# Patient Record
Sex: Male | Born: 1995 | Race: White | Hispanic: No | Marital: Single | Smoking: Former smoker
Health system: Southern US, Community
[De-identification: ages and names within clinical notes are randomized; demographics above are authoritative.]

## PROBLEM LIST (undated history)

## (undated) DIAGNOSIS — F909 Attention-deficit hyperactivity disorder, unspecified type: Secondary | ICD-10-CM

## (undated) HISTORY — PX: OTHER SURGICAL HISTORY: SHX169

---
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
## 2016-08-16 ENCOUNTER — Encounter (HOSPITAL_COMMUNITY): Payer: Self-pay | Admitting: Emergency Medicine

## 2016-08-16 ENCOUNTER — Emergency Department (HOSPITAL_COMMUNITY)
Admission: EM | Admit: 2016-08-16 | Discharge: 2016-08-16 | Disposition: A | Discharge status: Home / Self Care | Payer: 59 | Attending: Emergency Medicine | Admitting: Emergency Medicine

## 2016-08-16 ENCOUNTER — Emergency Department (HOSPITAL_COMMUNITY): Payer: 59

## 2016-08-16 DIAGNOSIS — Y999 Unspecified external cause status: Secondary | ICD-10-CM | POA: Diagnosis not present

## 2016-08-16 DIAGNOSIS — F17200 Nicotine dependence, unspecified, uncomplicated: Secondary | ICD-10-CM | POA: Diagnosis not present

## 2016-08-16 DIAGNOSIS — S4992XA Unspecified injury of left shoulder and upper arm, initial encounter: Secondary | ICD-10-CM | POA: Diagnosis present

## 2016-08-16 DIAGNOSIS — Y939 Activity, unspecified: Secondary | ICD-10-CM | POA: Insufficient documentation

## 2016-08-16 DIAGNOSIS — S42032A Displaced fracture of lateral end of left clavicle, initial encounter for closed fracture: Secondary | ICD-10-CM | POA: Insufficient documentation

## 2016-08-16 DIAGNOSIS — Y92410 Unspecified street and highway as the place of occurrence of the external cause: Secondary | ICD-10-CM | POA: Insufficient documentation

## 2016-08-16 HISTORY — DX: Attention-deficit hyperactivity disorder, unspecified type: F90.9
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
## 2017-03-27 ENCOUNTER — Other Ambulatory Visit: Payer: Self-pay

## 2017-03-27 ENCOUNTER — Ambulatory Visit: Payer: BC Managed Care – PPO | Admitting: Physician Assistant

## 2017-03-27 ENCOUNTER — Encounter: Payer: Self-pay | Admitting: Physician Assistant

## 2017-03-27 VITALS — BP 100/60 | HR 96 | Temp 98.1°F | Resp 14 | Ht 74.0 in | Wt 173.0 lb

## 2017-03-27 DIAGNOSIS — F900 Attention-deficit hyperactivity disorder, predominantly inattentive type: Secondary | ICD-10-CM | POA: Diagnosis not present

## 2017-03-27 DIAGNOSIS — Z87898 Personal history of other specified conditions: Secondary | ICD-10-CM

## 2017-03-27 DIAGNOSIS — F1011 Alcohol abuse, in remission: Secondary | ICD-10-CM | POA: Insufficient documentation

## 2017-03-27 MED ORDER — CLONIDINE HCL ER 0.1 MG PO TB12: ORAL_TABLET | ORAL | 3 refills | Status: DC

## 2017-03-27 NOTE — Assessment & Plan Note (Signed)
CSC signed. Will be scanned into file. Patient doing very well. Will send in refills. Vyvanse is not due until the 30th. Will send at that time. Patient to follow-up every 6 months for assessment. He is also to schedule CPE with fasting labs in the next few weeks.

## 2017-03-27 NOTE — Assessment & Plan Note (Signed)
Doing very well. Will continue to monitor.

## 2017-03-27 NOTE — Patient Instructions (Signed)
Please continue chronic medications as directed. I will take over for you now that you are established with us.  Keep up the good work with Alcoholics Anonymous. You should be very proud of yourself!  Please schedule a complete physical at your earliest convenience.  We will obtain labs at that appointment.   We will follow-up every 6 months for ADHD.  It was a pleasure meeting you today. Welcome to [facility]!

## 2017-03-27 NOTE — Progress Notes (Signed)
Patient presents to clinic today to establish care.  Diet -- Mostly snack foods. Is trying to work or better options.   Acute Concerns: Patient without acute concerns today.   Chronic Issues: ADHD -- is currently on Vyvanse 40 mg daily. Is tolerating well without side effect. Endorses great focus with medication. Is seeing [facility] currently but having issue with cost of co-pays. Would like [place] to take over medication.   Alcohol Abuse -- History of DUI. AA every night during the week. Is seven months sober and doing very well.   Health Maintenance: Immunizations -- patient feels up-to-date. Will obtain records.    Past Medical History:  Diagnosis Date  . ADHD     Past Surgical History:  Procedure Laterality Date  . arm surgery     Had to be sedated to have arm reset    Current Outpatient Medications on File Prior to Visit  Medication Sig Dispense Refill  . VYVANSE 40 MG capsule TAKE 1 CAPSULE BY MOUTH DAILY REFILL DUE 03/07/2017 OR 30 DAYS FROM LAST REFILL  0   No current facility-administered medications on file prior to visit.     No Known Allergies  Family History  Problem Relation Age of Onset  . Depression Mother   . Depression Father   . Mental illness Maternal Grandmother   . Mental illness Maternal Grandfather   . Mental illness Paternal Grandmother   . Mental illness Paternal Grandfather     Social History   Socioeconomic History  . Marital status: Single    Spouse name: Not on file  . Number of children: Not on file  . Years of education: Not on file  . Highest education level: Not on file  Social Needs  . Financial resource strain: Not on file  . Food insecurity - worry: Not on file  . Food insecurity - inability: Not on file  . Transportation needs - medical: Not on file  . Transportation needs - non-medical: Not on file  Occupational History  . Occupation: Energy manager  Tobacco Use  . Smoking status: Former Smoker    Packs/day: 1.00    Years: 4.00    Pack years: 4.00    Types: Cigarettes    Last attempt to quit: 08/25/2016    Years since quitting: 0.5  . Smokeless tobacco: Never Used  Substance and Sexual Activity  . 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 Drug use: No  . Sexual activity: Not Currently    Partners: Female  Other Topics Concern  . 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  HENT: Negative for ear discharge, ear pain, hearing loss and tinnitus.   Eyes: Negative for blurred vision, double vision, photophobia and pain.  Respiratory: Negative for cough and shortness of breath.   Cardiovascular: Negative for chest pain and palpitations.  Gastrointestinal: Negative for abdominal pain, blood in stool, constipation, diarrhea, heartburn, melena, nausea and vomiting.  Genitourinary: Negative for dysuria, flank pain, frequency, hematuria and urgency.  Musculoskeletal: Negative for falls.  Neurological: Negative for dizziness, loss of consciousness and headaches.  Endo/Heme/Allergies: Negative for environmental allergies.  Psychiatric/Behavioral: Negative for depression, hallucinations, substance abuse and suicidal ideas. The patient is not nervous/anxious and does not have insomnia.    BP 100/60  Pulse 96   Temp 98.1 F (36.7 C) (Oral)   Resp 14   Ht 6\' 2"  (1.88 m)   Wt 173 lb (78.5 kg)   SpO2 99%   BMI 22.21 kg/m   Physical Exam  Constitutional: He is oriented to person, place, and time and well-developed, well-nourished, and in no distress.  HENT:  Head: Normocephalic and atraumatic.  Eyes: Conjunctivae are normal.  Neck: Neck supple.  Cardiovascular: Normal rate, regular rhythm, normal heart sounds and intact distal pulses.  Pulmonary/Chest: Effort normal and breath sounds normal. No respiratory distress. He  has no wheezes. He has no rales. He exhibits no tenderness.  Neurological: He is alert and oriented to person, place, and time.  Skin: Skin is warm and dry. No rash noted.  Psychiatric: Affect normal.  Vitals reviewed.  Assessment/Plan: ADHD (attention deficit hyperactivity disorder), inattentive type CSC signed. Will be scanned into file. Patient doing very well. Will send in refills. Vyvanse is not due until the 30th. Will send at that time. Patient to follow-up every 6 months for assessment. He is also to schedule CPE with fasting labs in the next few weeks.   History of alcohol abuse Doing very well. Will continue to monitor.    Piedad ClimesWilliam Cody Trenisha Lafavor, PA-C

## 2017-04-02 ENCOUNTER — Telehealth: Payer: Self-pay | Admitting: Physician Assistant

## 2017-04-02 NOTE — Progress Notes (Deleted)
Patient presents to clinic today for annual exam.  Patient is fasting for labs.  Acute Concerns: ***  Health Maintenance: Immunizations -- up-to-date.  Past Medical History:  Diagnosis Date  . ADHD     Past Surgical History:  Procedure Laterality Date  . arm surgery     Had to be sedated to have arm reset    Current Outpatient Medications on File Prior to Visit  Medication Sig Dispense Refill  . cloNIDine HCl (KAPVAY) 0.1 MG TB12 ER tablet TAKE 1 TABLET BY MOUTH EVERYDAY AT BEDTIME 30 tablet 3  . VYVANSE 40 MG capsule TAKE 1 CAPSULE BY MOUTH DAILY REFILL DUE 03/07/2017 OR 30 DAYS FROM LAST REFILL  0   No current facility-administered medications on file prior to visit.     No Known Allergies  Family History  Problem Relation Age of Onset  . Depression Mother   . Depression Father   . Mental illness Maternal Grandmother   . Mental illness Maternal Grandfather   . Mental illness Paternal Grandmother   . Mental illness Paternal Grandfather     Social History   Socioeconomic History  . Marital status: Single    Spouse name: Not on file  . Number of children: Not on file  . Years of education: Not on file  . Highest education level: Not on file  Social Needs  . Financial resource strain: Not on file  . Food insecurity - worry: Not on file  . Food insecurity - inability: Not on file  . Transportation needs - medical: Not on file  . Transportation needs - non-medical: Not on file  Occupational History  . Occupation: [employer]  Tobacco Use  . Smoking status: Former Smoker    Packs/day: 1.00    Years: 4.00    Pack years: 4.00    Types: Cigarettes    Last attempt to quit: 08/25/2016    Years since quitting: 0.6  . Smokeless tobacco: Never Used  Substance and Sexual Activity  . 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 Drug use: No  . Sexual activity: Not Currently    Partners: Female  Other Topics Concern  . Not  on file  Social History Narrative   Works at [employer]   Plays electric guitar in band    Review of Systems  Constitutional: Negative for fever and weight loss.  HENT: Negative for ear discharge, ear pain, hearing loss and tinnitus.   Eyes: Negative for blurred vision, double vision, photophobia and pain.  Respiratory: Negative for cough and shortness of breath.   Cardiovascular: Negative for chest pain and palpitations.  Gastrointestinal: Negative for abdominal pain, blood in stool, constipation, diarrhea, heartburn, melena, nausea and vomiting.  Genitourinary: Negative for dysuria, flank pain, frequency, hematuria and urgency.  Musculoskeletal: Negative for falls.  Neurological: Negative for dizziness, loss of consciousness and headaches.  Endo/Heme/Allergies: Negative for environmental allergies.  Psychiatric/Behavioral: Negative for depression, hallucinations, substance abuse and suicidal ideas. The patient is not nervous/anxious and does not have insomnia.     There were no vitals taken for this visit.  Physical Exam  Constitutional: He is oriented to person, place, and time and well-developed, well-nourished, and in no distress.  HENT:  Head: Normocephalic and atraumatic.  Right Ear: External ear normal.  Left Ear: External ear normal.  Nose: Nose normal.  Mouth/Throat: Oropharynx is clear and moist. No oropharyngeal exudate.  Eyes:  Conjunctivae and EOM are normal. Pupils are equal, round, and reactive to light.  Neck: Neck supple. No thyromegaly present.  Cardiovascular: Normal rate, regular rhythm, normal heart sounds and intact distal pulses.  Pulmonary/Chest: Effort normal and breath sounds normal. No respiratory distress. He has no wheezes. He has no rales. He exhibits no tenderness.  Abdominal: Soft. Bowel sounds are normal. He exhibits no distension and no mass. There is no tenderness. There is no rebound and no guarding.  Genitourinary: Testes/scrotum normal and  penis normal. No discharge found.  Lymphadenopathy:    He has no cervical adenopathy.  Neurological: He is alert and oriented to person, place, and time.  Skin: Skin is warm and dry. No rash noted.  Psychiatric: Affect normal.  Vitals reviewed.   No results found for this or any previous visit (from the past 2160 hour(s)).  Assessment/Plan: No problem-specific Assessment & Plan notes found for this encounter.    Piedad Climes, PA-C

## 2017-04-02 NOTE — Telephone Encounter (Signed)
Vyvanse refill Last OV: 03/27/17 ; Next OV for physical 04/03/17 Last Refill:03/04/17 Pharmacy:[facility] [place] on [address]

## 2017-04-02 NOTE — Telephone Encounter (Signed)
Copied from CRM #[id number]. Topic: Quick Communication - Rx Refill/Question >> Apr 02, 2017  2:39 PM Guinevere FerrariMorris, Trestan Vahle E, NT wrote: Medication :VYVANSE 40 MG capsule:   Has the patient contacted their pharmacy? Yes    (Agent: If no, request that the patient contact the pharmacy for the refill.)  [id number] - [place], [place] - [address] [phone] (Phone) [phone] (Fax)   Preferred Pharmacy (with phone number or street name):    Agent: Please be advised that RX refills may take up to 3 business days. We ask that you follow-up with your pharmacy.

## 2017-04-03 ENCOUNTER — Encounter: Payer: BC Managed Care – PPO | Admitting: Physician Assistant

## 2017-04-03 DIAGNOSIS — Z0289 Encounter for other administrative examinations: Secondary | ICD-10-CM

## 2017-04-03 MED ORDER — VYVANSE 40 MG PO CAPS: ORAL_CAPSULE | ORAL | 0 refills | Status: DC

## 2017-04-03 NOTE — Telephone Encounter (Signed)
Rx sent 

## 2017-04-03 NOTE — Addendum Note (Signed)
Addended by: Waldon MerlMARTIN, Kaleya Douse C on: 04/03/2017 08:55 AM   Modules accepted: Orders

## 2017-04-30 ENCOUNTER — Other Ambulatory Visit: Payer: Self-pay | Admitting: Emergency Medicine

## 2017-04-30 MED ORDER — VYVANSE 40 MG PO CAPS: ORAL_CAPSULE | ORAL | 0 refills | Status: DC

## 2017-04-30 NOTE — Telephone Encounter (Signed)
Last rx for Vyvanse on 04/03/17 #30 CSC: 03/27/17

## 2017-05-01 ENCOUNTER — Other Ambulatory Visit: Payer: Self-pay | Admitting: Physician Assistant

## 2017-05-01 NOTE — Telephone Encounter (Signed)
Copied from CRM [phone]. Topic: Quick Communication - Rx Refill/Question >> May 01, 2017  4:35 PM Louie BunPalacios Medina, Rosey Batheresa D wrote: Medication: VYVANSE 40 MG capsule   Has the patient contacted their pharmacy?No because it's control substance med.   (Agent: If no, request that the patient contact the pharmacy for the refill.)   Preferred Pharmacy (with phone number or street name): [id number] - [place], [place] - [address]   Agent: Please be advised that RX refills may take up to 3 business days. We ask that you follow-up with your pharmacy.

## 2017-05-03 ENCOUNTER — Encounter: Payer: Self-pay | Admitting: Family Medicine

## 2017-05-03 ENCOUNTER — Other Ambulatory Visit: Payer: Self-pay

## 2017-05-03 ENCOUNTER — Ambulatory Visit: Payer: BC Managed Care – PPO | Admitting: Family Medicine

## 2017-05-03 VITALS — BP 122/80 | HR 103 | Temp 98.5°F | Ht 74.0 in | Wt 176.6 lb

## 2017-05-03 DIAGNOSIS — B9689 Other specified bacterial agents as the cause of diseases classified elsewhere: Secondary | ICD-10-CM | POA: Diagnosis not present

## 2017-05-03 DIAGNOSIS — J208 Acute bronchitis due to other specified organisms: Secondary | ICD-10-CM | POA: Diagnosis not present

## 2017-05-03 MED ORDER — AZITHROMYCIN 250 MG PO TABS
ORAL_TABLET | ORAL | 0 refills | Status: DC
Start: 2017-05-03 — End: 2017-10-16

## 2017-05-03 MED ORDER — GUAIFENESIN-CODEINE 100-10 MG/5ML PO SOLN: 5.0000 mL | Freq: Four times a day (QID) | ORAL | 0 refills | Status: DC | PRN

## 2017-05-03 NOTE — Progress Notes (Signed)
Subjective  CC:  Chief Complaint  Patient presents with  . Cough    Productive cough- brownish-green, had fever on monday.    HPI: SUBJECTIVE:  Mitchell Fry is a 22 y.o. male who complains of congestion, nasal blockage, post nasal drip, cough described as productive and denies sinus, high fevers, SOB, chest pain or significant GI symptoms. Symptoms have been present for 6 days. Had temp max of 99 earlier this week. No sinus pain. He denies a history of anorexia, dizziness, vomiting and wheezing. He denies a history of asthma or COPD. Patient denies and does not smoke cigarettes. Has been traveling on tour so multiple exposures. No GI sxs.   I reviewed the patients updated PMH, FH, and SocHx.  Social History: Patient  reports that he quit smoking about 8 months ago. His smoking use included cigarettes. He has a 4.00 pack-year smoking history. he has never used smokeless tobacco. He reports that he does not drink alcohol or use drugs.  Patient Active Problem List   Diagnosis Date Noted  . History of alcohol abuse 03/27/2017  . ADHD (attention deficit hyperactivity disorder), inattentive type 09/03/2013    Review of Systems: Cardiovascular: negative for chest pain Respiratory: negative for SOB or hemoptysis Gastrointestinal: negative for abdominal pain Genitourinary: negative for dysuria or gross hematuria Current Meds  Medication Sig  . cloNIDine HCl (KAPVAY) 0.1 MG TB12 ER tablet TAKE 1 TABLET BY MOUTH EVERYDAY AT BEDTIME  . VYVANSE 40 MG capsule TAKE 1 CAPSULE BY MOUTH DAIL    Objective  Vitals: BP 122/80   Pulse (!) 103   Temp 98.5 F (36.9 C)   Ht 6\' 2"  (1.88 m)   Wt 176 lb 9.6 oz (80.1 kg)   BMI 22.67 kg/m  General: no acute distress  Psych:  Alert and oriented, normal mood and affect HEENT:  Normocephalic, atraumatic, supple neck, moist mucous membranes, mildly erythematous pharynx without exudate, mild lymphadenopathy, supple neck Cardiovascular:  RRR without  murmur. no edema Respiratory:  Good breath sounds bilaterally, CTAB with normal respiratory effort with occasional rhonchi Skin:  Warm, no rashes Neurologic:   Mental status is normal. normal gait  Assessment  1. Acute bacterial bronchitis      Plan  Discussion:  Treat for bacterial bronchitis due to prolonged course and worsening symptoms. Education regarding differences between viral and bacterial infections and treatment options are discussed.  Supportive care measures are recommended.  We discussed the use of mucolytic's, decongestants, antihistamines and antitussives as needed.  Tylenol or Advil are recommended if needed.  Follow up: Return if symptoms worsen or fail to improve.   Commons side effects, risks, benefits, and alternatives for medications and treatment plan prescribed today were discussed, and the patient expressed understanding of the given instructions. Patient is instructed to call or message via MyChart if he/she has any questions or concerns regarding our treatment plan. No barriers to understanding were identified. We discussed Red Flag symptoms and signs in detail. Patient expressed understanding regarding what to do in case of urgent or emergency type symptoms.  Medication list was reconciled, printed and provided to the patient in AVS. Patient instructions and summary information was reviewed with the patient as documented in the AVS. This note was prepared with assistance of Dragon voice recognition software. Occasional wrong-word or sound-a-like substitutions may have occurred due to the inherent limitations of voice recognition software  No orders of the defined types were placed in this encounter.  Meds ordered this encounter  Medications  . azithromycin (ZITHROMAX) 250 MG tablet    Sig: Take 2 tabs today, then 1 tab daily for 4 days    Dispense:  1 each    Refill:  0  . guaiFENesin-codeine 100-10 MG/5ML syrup    Sig: Take 5 mLs by mouth every 6 (six) hours  as needed for cough.    Dispense:  120 mL    Refill:  0

## 2017-05-03 NOTE — Patient Instructions (Addendum)
Please follow up if symptoms do not improve or as needed.    Acute Bronchitis, Adult Acute bronchitis is sudden (acute) swelling of the air tubes (bronchi) in the lungs. Acute bronchitis causes these tubes to fill with mucus, which can make it hard to breathe. It can also cause coughing or wheezing. In adults, acute bronchitis usually goes away within 2 weeks. A cough caused by bronchitis may last up to 3 weeks. Smoking, allergies, and asthma can make the condition worse. Repeated episodes of bronchitis may cause further lung problems, such as chronic obstructive pulmonary disease (COPD). What are the causes? This condition can be caused by germs and by substances that irritate the lungs, including:  Cold and flu viruses. This condition is most often caused by the same virus that causes a cold.  Bacteria.  Exposure to tobacco smoke, dust, fumes, and air pollution.  What increases the risk? This condition is more likely to develop in people who:  Have close contact with someone with acute bronchitis.  Are exposed to lung irritants, such as tobacco smoke, dust, fumes, and vapors.  Have a weak immune system.  Have a respiratory condition such as asthma.  What are the signs or symptoms? Symptoms of this condition include:  A cough.  Coughing up clear, yellow, or green mucus.  Wheezing.  Chest congestion.  Shortness of breath.  A fever.  Body aches.  Chills.  A sore throat.  How is this diagnosed? This condition is usually diagnosed with a physical exam. During the exam, your health care provider may order tests, such as chest X-rays, to rule out other conditions. He or she may also:  Test a sample of your mucus for bacterial infection.  Check the level of oxygen in your blood. This is done to check for pneumonia.  Do a chest X-ray or lung function testing to rule out pneumonia and other conditions.  Perform blood tests.  Your health care provider will also ask  about your symptoms and medical history. How is this treated? Most cases of acute bronchitis clear up over time without treatment. Your health care provider may recommend:  Drinking more fluids. Drinking more makes your mucus thinner, which may make it easier to breathe.  Taking a medicine for a fever or cough.  Taking an antibiotic medicine.  Using an inhaler to help improve shortness of breath and to control a cough.  Using a cool mist vaporizer or humidifier to make it easier to breathe.  Follow these instructions at home: Medicines  Take over-the-counter and prescription medicines only as told by your health care provider.  If you were prescribed an antibiotic, take it as told by your health care provider. Do not stop taking the antibiotic even if you start to feel better. General instructions  Get plenty of rest.  Drink enough fluids to keep your urine clear or pale yellow.  Avoid smoking and secondhand smoke. Exposure to cigarette smoke or irritating chemicals will make bronchitis worse. If you smoke and you need help quitting, ask your health care provider. Quitting smoking will help your lungs heal faster.  Use an inhaler, cool mist vaporizer, or humidifier as told by your health care provider.  Keep all follow-up visits as told by your health care provider. This is important. How is this prevented? To lower your risk of getting this condition again:  Wash your hands often with soap and water. If soap and water are not available, use hand sanitizer.  Avoid contact with   people who have cold symptoms.  Try not to touch your hands to your mouth, nose, or eyes.  Make sure to get the flu shot every year.  Contact a health care provider if:  Your symptoms do not improve in 2 weeks of treatment. Get help right away if:  You cough up blood.  You have chest pain.  You have severe shortness of breath.  You become dehydrated.  You faint or keep feeling like you are  going to faint.  You keep vomiting.  You have a severe headache.  Your fever or chills gets worse. This information is not intended to replace advice given to you by your health care provider. Make sure you discuss any questions you have with your health care provider. Document Released: 03/30/2004 Document Revised: 09/15/2015 Document Reviewed: 08/11/2015 Elsevier Interactive Patient Education  2018 Elsevier Inc.   

## 2017-06-22 ENCOUNTER — Other Ambulatory Visit: Payer: Self-pay | Admitting: Physician Assistant

## 2017-06-22 NOTE — Telephone Encounter (Signed)
Copied from CRM [id number]. Topic: Quick Communication - Rx Refill/Question >> Jun 22, 2017  1:56 PM Diana EvesHoyt, Maryann B wrote: Medication: VYVANSE 40 MG capsule  Preferred Pharmacy (with phone number or street name): [id number] [place], [place] - [address] Agent: Please be advised that RX refills may take up to 3 business days. We ask that you follow-up with your pharmacy.

## 2017-06-25 MED ORDER — VYVANSE 40 MG PO CAPS: ORAL_CAPSULE | ORAL | 0 refills | Status: DC

## 2017-06-25 NOTE — Telephone Encounter (Signed)
Last OV: 03/27/17 Last Filled: 04/30/17 #30, 0

## 2017-07-17 ENCOUNTER — Other Ambulatory Visit: Payer: Self-pay | Admitting: Physician Assistant

## 2017-07-17 MED ORDER — VYVANSE 40 MG PO CAPS: ORAL_CAPSULE | ORAL | 0 refills | Status: DC

## 2017-07-17 NOTE — Telephone Encounter (Signed)
Copied from CRM [id number]. Topic: Quick Communication - Rx Refill/Question >> Jul 17, 2017 11:17 AM Floria Raveling A wrote: Medication: VYVANSE 40 MG capsule [[id number]]  Has the patient contacted their pharmacy?  (Agent: If no, request that the patient contact the pharmacy for the refill.) Preferred Pharmacy (with phone number or street [facility] [place]- pt is going on vacation on the 21st and want be back til the 16th of June.  He needs to refill this early before he leaves????   Agent: Please be advised that RX refills may take up to 3 business days. We ask that you follow-up with your pharmacy.

## 2017-07-17 NOTE — Telephone Encounter (Signed)
Patient is not due for Vyvanse until 07/25/17 so the prescription sent in notes this. He is due for follow-up before next refill.

## 2017-07-17 NOTE — Telephone Encounter (Signed)
Last Filled: 06/25/17 #30, 0 Last OV: 03/27/17

## 2017-07-17 NOTE — Telephone Encounter (Signed)
[facility] the [facility]/17.

## 2017-07-20 ENCOUNTER — Telehealth: Payer: Self-pay | Admitting: Emergency Medicine

## 2017-07-20 NOTE — Telephone Encounter (Signed)
Left a detailed message on voicemail advising patient the [facility].     Copied from CRM [id number]. Topic: Inquiry >> Jul 20, 2017 12:58 PM Yvonna Alanis wrote: Reason for CRM: Patient called to inquire of his request to receive his VYVANSE 40 MG capsule early as he is going on vacation on 07/24/2017. Please call patient.       Thank You!!!

## 2017-07-20 NOTE — Telephone Encounter (Signed)
Pt called and pharmacist has asked for pcp to call pharmacy to give approval to fill the Rx early

## 2017-08-21 ENCOUNTER — Other Ambulatory Visit: Payer: Self-pay | Admitting: Physician Assistant

## 2017-08-21 NOTE — Telephone Encounter (Signed)
Copied from CRM [id number]. Topic: Quick Communication - Rx Refill/Question >> Aug 21, 2017 10:42 AM [name], Mitchell C wrote: Medication: VYVANSE 40 MG capsule   Has the patient contacted their pharmacy? No  (Agent: If no, request that the patient contact the pharmacy for the refill.) (Agent: If yes, when and what did the pharmacy advise?)  Preferred Pharmacy (with phone number or street name): [id number] [place], [place] - [address] [phone] (Phone) [phone] (Fax)      Agent: Please be advised that RX refills may take up to 3 business days. We ask that you follow-up with your pharmacy.

## 2017-08-21 NOTE — Telephone Encounter (Signed)
Vyvanse refill Last Refill:07/25/17 #30 Last OV: 03/27/17 PCP: Laural Goldenody Martin,PA [facility]

## 2017-08-22 MED ORDER — VYVANSE 40 MG PO CAPS: ORAL_CAPSULE | ORAL | 0 refills | Status: DC

## 2017-09-17 ENCOUNTER — Other Ambulatory Visit: Payer: Self-pay | Admitting: Physician Assistant

## 2017-09-19 ENCOUNTER — Other Ambulatory Visit: Payer: Self-pay | Admitting: Physician Assistant

## 2017-09-19 NOTE — Telephone Encounter (Signed)
Copied from CRM [id number]. Topic: Quick Communication - Rx Refill/Question >> Sep 19, 2017  2:53 PM Jonette EvaBarksdale, Harvey B wrote: Medication: VYVANSE 40 MG capsule [[id number]][[id number]]   Has the patient contacted their pharmacy? Yes.   (Agent: If no, request that the patient contact the pharmacy for the refill.) (Agent: If yes, when and what did the pharmacy advise?)  [facility]  Agent: Please be advised that RX refills may take up to 3 business days. We ask that you follow-up with your pharmacy.

## 2017-09-20 NOTE — Telephone Encounter (Signed)
Refill of vyvanse  LRF 08/22/17  #30  0 refills  LOV 05/03/17 Dr. Mardelle MatteAndy  [id number] [place], [place] - [address]

## 2017-09-21 ENCOUNTER — Other Ambulatory Visit: Payer: Self-pay | Admitting: Emergency Medicine

## 2017-09-21 MED ORDER — VYVANSE 40 MG PO CAPS: ORAL_CAPSULE | ORAL | 0 refills | Status: DC

## 2017-09-21 NOTE — Telephone Encounter (Signed)
Vyvanse last filled 08/22/17 #30 He is due for 6 month follow up for ADHD Please advise  Copied from CRM #[id number]. Topic: Inquiry >> Sep 21, 2017 11:24 AM Yvonna Alanisobinson, Andra M wrote: Reason for CRM: Patient called requesting a update on his refill for VYVANSE 40 MG capsule. Patient is completely out. Please call patient.       Thank You!!!

## 2017-09-21 NOTE — Telephone Encounter (Signed)
Left detailed message on VM advising patient rx for Vyvanse was sent to the pharmacy but he is due for an follow up appointment within the next month. Please call the office to schedule an appointment

## 2017-09-21 NOTE — Telephone Encounter (Signed)
Refill sent to pharmacy.  Needs follow-up within the next month.

## 2017-09-25 NOTE — Telephone Encounter (Signed)
Filled last week

## 2017-10-16 ENCOUNTER — Ambulatory Visit: Payer: BC Managed Care – PPO | Admitting: Physician Assistant

## 2017-10-16 ENCOUNTER — Other Ambulatory Visit: Payer: Self-pay

## 2017-10-16 ENCOUNTER — Encounter: Payer: Self-pay | Admitting: Physician Assistant

## 2017-10-16 VITALS — BP 120/74 | HR 94 | Temp 98.2°F | Resp 15 | Ht 74.0 in | Wt 174.4 lb

## 2017-10-16 DIAGNOSIS — F900 Attention-deficit hyperactivity disorder, predominantly inattentive type: Secondary | ICD-10-CM

## 2017-10-16 MED ORDER — VYVANSE 40 MG PO CAPS: ORAL_CAPSULE | ORAL | 0 refills | Status: DC

## 2017-10-16 NOTE — Patient Instructions (Signed)
Please continue medications as directed. Follow-up with me in 6 months for your complete physical.

## 2017-10-16 NOTE — Progress Notes (Signed)
Patient presents to clinic today for follow-up of ADHD. Patient is currently on a regimen of Vyvanse 40 mg daily. Endorses taking as directed and tolerating well. Notes great focus with this regimen. Is sleeping and eating well. Notes mood is stable. CSC up-to-date.    Past Medical History:  Diagnosis Date  . ADHD     Current Outpatient Medications on File Prior to Visit  Medication Sig Dispense Refill  . CLARAVIS 40 MG capsule Take 40 mg by mouth 2 (two) times daily.  0   No current facility-administered medications on file prior to visit.     No Known Allergies  Family History  Problem Relation Age of Onset  . Depression Mother   . Depression Father   . Mental illness Maternal Grandmother   . Mental illness Maternal Grandfather   . Mental illness Paternal Grandmother   . Mental illness Paternal Grandfather     Social History   Socioeconomic History  . Marital status: Single    Spouse name: Not on file  . Number of children: Not on file  . Years of education: Not on file  . Highest education level: Not on file  Occupational History  . Occupation: Programmer, [employer]  Social Needs  . Financial resource strain: Not on file  . Food insecurity:    Worry: Not on file    Inability: Not on file  . Transportation needs:    Medical: Not on file    Non-medical: Not on file  Tobacco Use  . Smoking status: Former Smoker    Packs/day: 1.00    Years: 4.00    Pack years: 4.00    Types: Cigarettes    Last attempt to quit: 08/25/2016    Years since quitting: 1.1  . Smokeless tobacco: Never Used  Substance and Sexual Activity  . 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 Drug use: No  . Sexual activity: Not Currently    Partners: Female  Lifestyle  . Physical activity:    Days per week: Not on file    Minutes per session: Not on file  . Stress: Not on file  Relationships  . Social connections:    Talks on phone: Not on file    Gets  together: Not on file    Attends religious service: Not on file    Active member of club or organization: Not on file    Attends meetings of clubs or organizations: Not on file    Relationship status: Not on file  Other Topics Concern  . Not on file  Social History Narrative   Works at [employer]   Plays electric guitar in band   Review of Systems - See HPI.  All other ROS are negative.  BP 120/74   Pulse 94   Temp 98.2 F (36.8 C) (Oral)   Resp 15   Ht 6\' 2"  (1.88 m)   Wt 174 lb 6.4 oz (79.1 kg)   SpO2 99%   BMI 22.39 kg/m   Physical Exam  Constitutional: He appears well-developed and well-nourished.  HENT:  Head: Normocephalic and atraumatic.  Cardiovascular: Normal rate, regular rhythm, normal heart sounds and intact distal pulses.  Pulmonary/Chest: Effort normal and breath sounds normal.  Vitals reviewed.  Assessment/Plan: ADHD (attention deficit hyperactivity disorder), inattentive type Doing very well. CSC up-to-date. CS database reviewed - no red flags. Medications refilled. Follow-up 6 months for CPE. Will obtain  UDS at that time.     Piedad ClimesWilliam Cody Nile Prisk, PA-C

## 2017-10-17 NOTE — Assessment & Plan Note (Signed)
Doing very well. CSC up-to-date. CS database reviewed - no red flags. Medications refilled. Follow-up 6 months for CPE. Will obtain UDS at that time.

## 2017-10-18 ENCOUNTER — Ambulatory Visit: Payer: BC Managed Care – PPO | Admitting: Physician Assistant

## 2017-11-19 ENCOUNTER — Other Ambulatory Visit: Payer: Self-pay | Admitting: Physician Assistant

## 2017-11-19 MED ORDER — VYVANSE 40 MG PO CAPS: ORAL_CAPSULE | ORAL | 0 refills | Status: DC

## 2017-11-19 NOTE — Telephone Encounter (Signed)
Last rx for Vyvanse was on 10/16/17 #30 Last OV 10/16/17 CSC signed 03/27/17  Please advise

## 2017-11-19 NOTE — Telephone Encounter (Signed)
Copied from CRM [id number]. Topic: Quick Communication - Rx Refill/Question >> Nov 19, 2017 11:40 AM Gaynelle AduPoole, Shalonda wrote: Medication: VYVANSE 40 MG capsule  Has the patient contacted their pharmacy? No   Preferred Pharmacy (with phone number or street name): [id number] [place], [place] - [address] [phone] (Phone) [phone] (Fax)    Agent: Please be advised that RX refills may take up to 3 business days. We ask that you follow-up with your pharmacy.

## 2017-12-17 ENCOUNTER — Other Ambulatory Visit: Payer: Self-pay | Admitting: Physician Assistant

## 2017-12-17 NOTE — Telephone Encounter (Signed)
Copied from CRM [id number]. Topic: Quick Communication - Rx Refill/Question >> Dec 17, 2017  1:53 PM Luanna Cole wrote: Medication: VYVANSE 40 MG capsule [[id number]]   Has the patient contacted their pharmacy? no Preferred Pharmacy (with phone number or street name):[id number] [place], [place] - [address] [phone] ([phone] (Fax)   Agent: Please be advised that RX refills may take up to 3 business days. We ask that you follow-up with your pharmacy.

## 2017-12-17 NOTE — Telephone Encounter (Signed)
Requested medication (s) are due for refill today:   Yes  Requested medication (s) are on the active medication list:   Yes  Future visit scheduled:   Yes 04/18/18 with Marcelline Mates   Last ordered: 11/19/17   #30  0 refills   Requested Prescriptions  Pending Prescriptions Disp Refills   VYVANSE 40 MG capsule 30 capsule 0    Sig: TAKE 1 CAPSULE BY MOUTH DAIL     Not Delegated - Psychiatry:  Stimulants/ADHD Failed - 12/17/2017  2:11 PM      Failed - This refill cannot be delegated      Failed - Urine Drug Screen completed in last 360 days.      Passed - Valid encounter within last 3 months    Recent Outpatient Visits          2 months ago ADHD (attention deficit hyperactivity disorder), inattentive type   [facility], [place] C, [place]   7 months ago Acute bacterial bronchitis   [facility], Malachi Bonds, MD   8 months ago ADHD (attention deficit hyperactivity disorder), inattentive type   [facility], Kristine Garbe, [place]      Future Appointments            In 4 months Waldon Merl, PA-C [facility], [place]

## 2017-12-18 MED ORDER — VYVANSE 40 MG PO CAPS: ORAL_CAPSULE | ORAL | 0 refills | Status: DC

## 2018-01-15 ENCOUNTER — Other Ambulatory Visit: Payer: Self-pay | Admitting: Physician Assistant

## 2018-01-15 NOTE — Telephone Encounter (Signed)
Copied from CRM [id number]. Topic: Quick Communication - Rx Refill/Question >> Jan 15, 2018  2:20 PM Arlyss Gandyichardson, Yolette Hastings N, NT wrote: Medication: VYVANSE 40 MG capsule   Has the patient contacted their pharmacy? Yes.   (Agent: If no, request that the patient contact the pharmacy for the refill.) (Agent: If yes, when and what did the pharmacy advise?)  Preferred Pharmacy (with phone number or street name): [id number] [place] [place] - [phone] (Phone) [phone] (Fax)  Pt is on vacation  Agent: Please be advised that RX refills may take up to 3 business days. We ask that you follow-up with your pharmacy.

## 2018-01-15 NOTE — Telephone Encounter (Signed)
Requested medication (s) are due for refill today: yes  Requested medication (s) are on the active medication list: yes  Last refill:  12/17/17  Future visit scheduled: yes  Notes to clinic:  Not delegated    Requested Prescriptions  Pending Prescriptions Disp Refills   VYVANSE 40 MG capsule 30 capsule 0    Sig: TAKE 1 CAPSULE BY MOUTH DAIL     Not Delegated - Psychiatry:  Stimulants/ADHD Failed - 01/15/2018  2:22 PM      Failed - This refill cannot be delegated      Failed - Urine Drug Screen completed in last 360 days.      Failed - Valid encounter within last 3 months    Recent Outpatient Visits          3 months ago ADHD (attention deficit hyperactivity disorder), inattentive type   [facility], [place], [place]   8 months ago Acute bacterial bronchitis   [facility], Malachi Bondsamille L, [place]   9 months ago ADHD (attention deficit hyperactivity disorder), inattentive type   [facility], Kristine Gar[place] C, [place]      Future Appointments            In 3 months Waldon MerlMartin, William C, PA-C [facility] [place]

## 2018-01-16 MED ORDER — VYVANSE 40 MG PO CAPS: ORAL_CAPSULE | ORAL | 0 refills | Status: DC

## 2018-01-16 NOTE — Telephone Encounter (Signed)
Last OV 10/16/17 vyvanse last filled 12/18/17 #30 with 0

## 2018-01-17 MED ORDER — VYVANSE 40 MG PO CAPS
ORAL_CAPSULE | ORAL | 0 refills | Status: DC
Start: 2018-01-17 — End: 2018-02-15

## 2018-01-17 NOTE — Telephone Encounter (Signed)
Patient calling and states that the VYVANSE 40 MG capsule prescription was sent to the wrong pharmacy. Please advise. Would [id number] [place], [place] - [address]

## 2018-01-17 NOTE — Addendum Note (Signed)
Addended by: Geannie RisenBRODMERKEL, JESSICA L on: 01/17/2018 01:39 PM   Modules accepted: Orders

## 2018-01-17 NOTE — Telephone Encounter (Signed)
Pt called in wanting to advise us that we can not just transfer the script to the new pharmacy, since it is a controlled substance a whole new RX needs to be sent in, in order for the pharmacy in [place] to fill it.

## 2018-01-17 NOTE — Telephone Encounter (Signed)
Last OV 10/16/17 vyvanse last filled 01/16/18 #30 with 0  Please advise pt would like this sent to [place].

## 2018-01-18 NOTE — Telephone Encounter (Signed)
Will have to wait until PCP returns since requesting a controlled substance to another state.

## 2018-02-14 ENCOUNTER — Other Ambulatory Visit: Payer: Self-pay | Admitting: Physician Assistant

## 2018-02-14 NOTE — Telephone Encounter (Signed)
Last OV: 10/16/2017 Last Fill:  01/17/2018, #30 with 0 RF

## 2018-02-14 NOTE — Telephone Encounter (Signed)
Copied from CRM [id number]. Topic: Quick Communication - Rx Refill/Question >> Feb 14, 2018  9:39 AM Gerrianne ScalePayne, Rebeca Valdivia L wrote: Medication: VYVANSE 40 MG capsule  Has the patient contacted their pharmacy? Yes.  today (Agent: If no, request that the patient contact the pharmacy for the refill.) (Agent: If yes, when and what did the pharmacy advise?) to call provider to get this filled  Preferred Pharmacy (with phone number or street name):     [id number] [place], [place] - [address] [phone])    Agent: Please be advised that RX refills may take up to 3 business days. We ask that you follow-up with your pharmacy.

## 2018-02-14 NOTE — Telephone Encounter (Signed)
LMOVM for patient to return call for f/u med appt

## 2018-02-15 MED ORDER — VYVANSE 40 MG PO CAPS
ORAL_CAPSULE | ORAL | 0 refills | Status: DC
Start: 2018-02-15 — End: 2018-03-13

## 2018-02-15 NOTE — Telephone Encounter (Signed)
Patient scheduled an appt for Monday, December 16th Patient said he thought he only had to come in every 6 months for this medication. Patient would like to know if the refill could still be sent in since he made the appt. Please advise

## 2018-02-15 NOTE — Addendum Note (Signed)
Addended by: Waldon MerlMARTIN, Rylan Bernard C on: 02/15/2018 11:31 AM   Modules accepted: Orders

## 2018-02-15 NOTE — Telephone Encounter (Signed)
LMOVM advising patient that we will [facility] [place].  We will cancel his appointment for 02/18/18. Per PCP to be seen every 6 months for ADHD.  Apologized for the inconvenient.

## 2018-02-15 NOTE — Telephone Encounter (Signed)
Refill has been sent.  °

## 2018-02-15 NOTE — Addendum Note (Signed)
Addended byDene Gentry: PETERMAN, AMY M on: 02/15/2018 11:09 AM   Modules accepted: Orders

## 2018-02-18 ENCOUNTER — Ambulatory Visit: Payer: BC Managed Care – PPO | Admitting: Physician Assistant

## 2018-03-13 ENCOUNTER — Other Ambulatory Visit: Payer: Self-pay | Admitting: Physician Assistant

## 2018-03-13 MED ORDER — VYVANSE 40 MG PO CAPS: ORAL_CAPSULE | ORAL | 0 refills | Status: DC

## 2018-03-13 NOTE — Telephone Encounter (Signed)
Last refill:02/15/18 #30, 0 Last OV:10/16/17

## 2018-03-13 NOTE — Telephone Encounter (Signed)
Copied from CRM [id number]. Topic: Quick Communication - Rx Refill/Question >> Mar 13, 2018 10:37 AM Jolayne Haines L wrote: Medication: VYVANSE 40 MG capsule  Has the patient contacted their pharmacy? Yes they said call office because this is controlled substance  (Agent: If no, request that the patient contact the pharmacy for the refill.) (Agent: If yes, when and what did the pharmacy advise?)  Preferred Pharmacy (with phone number or street name): [id number] [place], [place] - [address] [place] [address]    Agent: Please be advised that RX refills may take up to 3 business days. We ask that you follow-up with your pharmacy.

## 2018-04-09 ENCOUNTER — Other Ambulatory Visit: Payer: Self-pay | Admitting: Physician Assistant

## 2018-04-09 NOTE — Telephone Encounter (Signed)
Copied from CRM [id number]. Topic: Quick Communication - Rx Refill/Question >> Apr 09, 2018 11:44 AM Elliot Gault wrote:  Medication: VYVANSE 40 MG capsule   Has the patient contacted their pharmacy? Yes   (Agent: If yes, when and what did the pharmacy advise?) to contact PCP office   Preferred Pharmacy (with phone number or street name):   [id number] [place], [place] - [address] [phone] (Phone) [phone] (Fax)  Agent: Please be advised that RX refills may take up to 3 business days. We ask that you follow-up with your pharmacy.

## 2018-04-10 MED ORDER — VYVANSE 40 MG PO CAPS: ORAL_CAPSULE | ORAL | 0 refills | Status: DC

## 2018-04-10 NOTE — Telephone Encounter (Signed)
Refill printed. He can pick up as requested. Will be due for follow-up before next refill.

## 2018-04-10 NOTE — Telephone Encounter (Signed)
Last refill: 03/13/18 #30, 0 Last OV:10/16/17

## 2018-04-10 NOTE — Telephone Encounter (Signed)
Copied from CRM [id number]. Topic: Quick Communication - Rx Refill/Question >> Apr 10, 2018  8:15 AM Mitchell Fry wrote: Medication: VYVANSE 40 MG capsule   Has the patient contacted their pharmacy? Yes (earliest fill date is 01.08.2020) Pt is going out of town on 01.06.2020 and would like to know if he can pick up his Rx before he leaves so that he has them while he is out of town or if the meds can be transferred from his pharmacy to an out of town pharmacy when it is sent/ please advise    Preferred Pharmacy (with phone number or street name): [id number] [place], [place] - [address] [phone] ([phone] (Fax)    Mitchell Fry: Please be advised that RX refills may take up to 3 business days. We ask that you follow-up with your pharmacy.

## 2018-04-10 NOTE — Telephone Encounter (Signed)
Requested medication (s) are due for refill today: yES  Requested medication (s) are on the active medication list: yES  Last refill:  03/13/18  Future visit scheduled: Yes  Notes to clinic:  Pt. Would like to pick up prescription.    Requested Prescriptions  Pending Prescriptions Disp Refills   VYVANSE 40 MG capsule 30 capsule 0    Sig: TAKE 1 CAPSULE BY MOUTH DAIL     Not Delegated - Psychiatry:  Stimulants/ADHD Failed - 04/10/2018  8:18 AM      Failed - This refill cannot be delegated      Failed - Urine Drug Screen completed in last 360 days.      Failed - Valid encounter within last 3 months    Recent Outpatient Visits          5 months ago ADHD (attention deficit hyperactivity disorder), inattentive type   [facility], [place], [place]   11 months ago Acute bacterial bronchitis   [facility], Malachi Bonds, [place]   1 year ago ADHD (attention deficit hyperactivity disorder), inattentive type   [facility], Kristine Garbe, [place]      Future Appointments            In 1 week Waldon Merl, PA-C [facility] [place], [place]

## 2018-04-11 NOTE — Telephone Encounter (Signed)
The patient is calling back to state he went to the pharmacy and they advise that the DR will need to call in to fill the prescription due to early request. The patient stated he is leaving out of town on 04-12-2018. Please advise. The number to contact for the pharmacy is  [phone]

## 2018-04-11 NOTE — Telephone Encounter (Signed)
[facility] was informed to do an early refill this one time. LMOVM letting patient know that Selena Batten approved an early refill for his medication.

## 2018-04-11 NOTE — Telephone Encounter (Signed)
Ok to fill early as he is leaving town. This is a one-time early fill

## 2018-04-18 ENCOUNTER — Encounter: Payer: BC Managed Care – PPO | Admitting: Physician Assistant

## 2018-04-23 ENCOUNTER — Encounter: Payer: BC Managed Care – PPO | Admitting: Physician Assistant

## 2018-04-30 ENCOUNTER — Ambulatory Visit (INDEPENDENT_AMBULATORY_CARE_PROVIDER_SITE_OTHER): Payer: BC Managed Care – PPO | Admitting: Physician Assistant

## 2018-04-30 ENCOUNTER — Encounter: Payer: Self-pay | Admitting: Physician Assistant

## 2018-04-30 ENCOUNTER — Encounter: Payer: Self-pay | Admitting: Emergency Medicine

## 2018-04-30 ENCOUNTER — Other Ambulatory Visit (HOSPITAL_COMMUNITY)
Admission: RE | Admit: 2018-04-30 | Discharge: 2018-04-30 | Disposition: A | Discharge status: Home / Self Care | Payer: BC Managed Care – PPO | Source: Ambulatory Visit | Attending: Physician Assistant | Admitting: Physician Assistant

## 2018-04-30 ENCOUNTER — Other Ambulatory Visit: Payer: Self-pay

## 2018-04-30 VITALS — BP 124/62 | HR 80 | Temp 97.8°F | Resp 14 | Ht 74.0 in | Wt 170.0 lb

## 2018-04-30 DIAGNOSIS — Z113 Encounter for screening for infections with a predominantly sexual mode of transmission: Secondary | ICD-10-CM

## 2018-04-30 DIAGNOSIS — Z23 Encounter for immunization: Secondary | ICD-10-CM | POA: Diagnosis not present

## 2018-04-30 DIAGNOSIS — Z0000 Encounter for general adult medical examination without abnormal findings: Secondary | ICD-10-CM | POA: Diagnosis not present

## 2018-04-30 DIAGNOSIS — F900 Attention-deficit hyperactivity disorder, predominantly inattentive type: Secondary | ICD-10-CM

## 2018-04-30 LAB — CBC WITH DIFFERENTIAL/PLATELET
BASOS ABS: 0 10*3/uL (ref 0.0–0.1)
Basophils Relative: 0.4 % (ref 0.0–3.0)
Eosinophils Absolute: 0.1 10*3/uL (ref 0.0–0.7)
Eosinophils Relative: 2 % (ref 0.0–5.0)
HCT: 46.7 % (ref 39.0–52.0)
Hemoglobin: 15.9 g/dL (ref 13.0–17.0)
Lymphocytes Relative: 44.7 % (ref 12.0–46.0)
Lymphs Abs: 1.9 10*3/uL (ref 0.7–4.0)
MCHC: 34.1 g/dL (ref 30.0–36.0)
MCV: 92.3 fl (ref 78.0–100.0)
Monocytes Absolute: 0.5 10*3/uL (ref 0.1–1.0)
Monocytes Relative: 11.1 % (ref 3.0–12.0)
NEUTROS ABS: 1.7 10*3/uL (ref 1.4–7.7)
Neutrophils Relative %: 41.8 % — ABNORMAL LOW (ref 43.0–77.0)
PLATELETS: 287 10*3/uL (ref 150.0–400.0)
RBC: 5.06 Mil/uL (ref 4.22–5.81)
RDW: 12.3 % (ref 11.5–15.5)
WBC: 4.2 10*3/uL (ref 4.0–10.5)

## 2018-04-30 LAB — LIPID PANEL
Cholesterol: 161 mg/dL (ref 0–200)
HDL: 48.7 mg/dL (ref >39.00)
LDL CALC: 94 mg/dL (ref 0–99)
NONHDL: 112.76
Total CHOL/HDL Ratio: 3
Triglycerides: 93 mg/dL (ref 0.0–149.0)
VLDL: 18.6 mg/dL (ref 0.0–40.0)

## 2018-04-30 LAB — COMPREHENSIVE METABOLIC PANEL
ALT: 25 U/L (ref 0–53)
AST: 24 U/L (ref 0–37)
Albumin: 4.7 g/dL (ref 3.5–5.2)
Alkaline Phosphatase: 84 U/L (ref 39–117)
BILIRUBIN TOTAL: 0.4 mg/dL (ref 0.2–1.2)
BUN: 16 mg/dL (ref 6–23)
CHLORIDE: 103 meq/L (ref 96–112)
CO2: 28 mEq/L (ref 19–32)
Calcium: 9.9 mg/dL (ref 8.4–10.5)
Creatinine, Ser: 0.76 mg/dL (ref 0.40–1.50)
GFR: 127.94 mL/min (ref >60.00)
Glucose, Bld: 65 mg/dL — ABNORMAL LOW (ref 70–99)
Potassium: 4 mEq/L (ref 3.5–5.1)
Sodium: 139 mEq/L (ref 135–145)
Total Protein: 7.3 g/dL (ref 6.0–8.3)

## 2018-04-30 NOTE — Patient Instructions (Signed)
-Please go to the lab for blood work.  -Our office will call you with your results unless you have chosen to receive results via MyChart. -If your blood work is normal we will follow-up each year for physicals and as scheduled for chronic medical problems. -If anything is abnormal we will treat accordingly and get you in for a follow-up.   Preventive Care 18-39 Years, Male Preventive care refers to lifestyle choices and visits with your health care provider that can promote health and wellness. What does preventive care include?   A yearly physical exam. This is also called an annual well check.  Dental exams once or twice a year.  Routine eye exams. Ask your health care provider how often you should have your eyes checked.  Personal lifestyle choices, including: ? Daily care of your teeth and gums. ? Regular physical activity. ? Eating a healthy diet. ? Avoiding tobacco and drug use. ? Limiting alcohol use. ? Practicing safe sex. What happens during an annual well check? The services and screenings done by your health care provider during your annual well check will depend on your age, overall health, lifestyle risk factors, and family history of disease. Counseling Your health care provider may ask you questions about your:  Alcohol use.  Tobacco use.  Drug use.  Emotional well-being.  Home and relationship well-being.  Sexual activity.  Eating habits.  Work and work environment. Screening You may have the following tests or measurements:  Height, weight, and BMI.  Blood pressure.  Lipid and cholesterol levels. These may be checked every 5 years starting at age 20.  Diabetes screening. This is done by checking your blood sugar (glucose) after you have not eaten for a while (fasting).  Skin check.  Hepatitis C blood test.  Hepatitis B blood test.  Sexually transmitted disease (STD) testing. Discuss your test results, treatment options, and if necessary,  the need for more tests with your health care provider. Vaccines Your health care provider may recommend certain vaccines, such as:  Influenza vaccine. This is recommended every year.  Tetanus, diphtheria, and acellular pertussis (Tdap, Td) vaccine. You may need a Td booster every 10 years.  Varicella vaccine. You may need this if you have not been vaccinated.  HPV vaccine. If you are 26 or younger, you may need three doses over 6 months.  Measles, mumps, and rubella (MMR) vaccine. You may need at least one dose of MMR.You may also need a second dose.  Pneumococcal 13-valent conjugate (PCV13) vaccine. You may need this if you have certain conditions and have not been vaccinated.  Pneumococcal polysaccharide (PPSV23) vaccine. You may need one or two doses if you smoke cigarettes or if you have certain conditions.  Meningococcal vaccine. One dose is recommended if you are age 19-21 years and a first-year college student living in a residence hall, or if you have one of several medical conditions. You may also need additional booster doses.  Hepatitis A vaccine. You may need this if you have certain conditions or if you travel or work in places where you may be exposed to hepatitis A.  Hepatitis B vaccine. You may need this if you have certain conditions or if you travel or work in places where you may be exposed to hepatitis B.  Haemophilus influenzae type b (Hib) vaccine. You may need this if you have certain risk factors. Talk to your health care provider about which screenings and vaccines you need and how often you need them.   This information is not intended to replace advice given to you by your health care provider. Make sure you discuss any questions you have with your health care provider. Document Released: 04/18/2001 Document Revised: 10/03/2016 Document Reviewed: 12/22/2014 Elsevier Interactive Patient Education  2019 Elsevier Inc.   

## 2018-04-30 NOTE — Progress Notes (Signed)
Patient presents to clinic today for annual exam.  Patient is fasting for labs.  Acute Concerns: Denies acute concerns today. Is requesting a routine STI screening. Has one partner, long-term.   Chronic Issues: ADHD-- Patient is on a regimen of Vyvanze 40 mg daily. Endorses taking medication as directed. Has a very consistent schedule at work now which is helping. Tries to keeping a regular bedtime. Is sleeping well. Denies any noted side effect of medication. Patient denies chest pain, palpitations, lightheadedness, dizziness, vision changes or frequent headaches.Patient is due to update his CSC and UDS today.  Health Maintenance: Immunizations -- Due for flu shot. Agrees to have today.   Past Medical History:  Diagnosis Date  . ADHD     Past Surgical History:  Procedure Laterality Date  . arm surgery     Had to be sedated to have arm reset    Current Outpatient Medications on File Prior to Visit  Medication Sig Dispense Refill  . VYVANSE 40 MG capsule TAKE 1 CAPSULE BY MOUTH DAIL 30 capsule 0   No current facility-administered medications on file prior to visit.     No Known Allergies  Family History  Problem Relation Age of Onset  . Depression Mother   . Depression Father   . Mental illness Maternal Grandmother   . Mental illness Maternal Grandfather   . Mental illness Paternal Grandmother   . Mental illness Paternal Grandfather     Social History   Socioeconomic History  . Marital status: Single    Spouse name: Not on file  . Number of children: Not on file  . Years of education: Not on file  . Highest education level: Not on file  Occupational History  . Occupation: Programmer, applications  . Financial resource strain: Not on file  . Food insecurity:    Worry: Not on file    Inability: Not on file  . Transportation needs:    Medical: Not on file    Non-medical: Not on file  Tobacco Use  . Smoking status: Former Smoker    Packs/day: 1.00   Years: 4.00    Pack years: 4.00    Types: Cigarettes    Last attempt to quit: 08/25/2016    Years since quitting: 1.6  . Smokeless tobacco: Never Used  Substance and Sexual Activity  . 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 Drug use: No  . Sexual activity: Not Currently    Partners: Female  Lifestyle  . Physical activity:    Days per week: Not on file    Minutes per session: Not on file  . Stress: Not on file  Relationships  . Social connections:    Talks on phone: Not on file    Gets together: Not on file    Attends religious service: Not on file    Active member of club or organization: Not on file    Attends meetings of clubs or organizations: Not on file    Relationship status: Not on file  . Intimate partner violence:    Fear of current or ex partner: Not on file    Emotionally abused: Not on file    Physically abused: Not on file    Forced sexual activity: Not on file  Other Topics Concern  . 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  HENT: Negative for ear discharge, ear pain, hearing loss and tinnitus.   Eyes: Negative for blurred vision, double vision, photophobia and pain.  Respiratory: Negative for cough and shortness of breath.   Cardiovascular: Negative for chest pain and palpitations.  Gastrointestinal: Negative for abdominal pain, blood in stool, constipation, diarrhea, heartburn, melena, nausea and vomiting.  Genitourinary: Negative for dysuria, flank pain, frequency, hematuria and urgency.  Musculoskeletal: Negative for falls.  Neurological: Negative for dizziness, loss of consciousness and headaches.  Endo/Heme/Allergies: Negative for environmental allergies.  Psychiatric/Behavioral: Negative for depression, hallucinations, substance abuse and suicidal ideas. The patient is not  nervous/anxious and does not have insomnia.    BP 124/62   Pulse 80   Temp 97.8 F (36.6 C) (Oral)   Resp 14   Ht 6\' 2"  (1.88 m)   Wt 170 lb (77.1 kg)   SpO2 100%   BMI 21.83 kg/m   Physical Exam Vitals signs reviewed.  Constitutional:      General: He is not in acute distress.    Appearance: He is well-developed. He is not diaphoretic.  HENT:     Head: Normocephalic and atraumatic.     Right Ear: Tympanic membrane, ear canal and external ear normal.     Left Ear: Tympanic membrane, ear canal and external ear normal.     Nose: Nose normal.     Mouth/Throat:     Pharynx: No posterior oropharyngeal erythema.  Eyes:     Conjunctiva/sclera: Conjunctivae normal.     Pupils: Pupils are equal, round, and reactive to light.  Neck:     Musculoskeletal: Neck supple.     Thyroid: No thyromegaly.  Cardiovascular:     Rate and Rhythm: Normal rate and regular rhythm.     Heart sounds: Normal heart sounds.  Pulmonary:     Effort: Pulmonary effort is normal. No respiratory distress.     Breath sounds: Normal breath sounds. No wheezing or rales.  Chest:     Chest wall: No tenderness.  Abdominal:     General: Bowel sounds are normal. There is no distension.     Palpations: Abdomen is soft. There is no mass.     Tenderness: There is no abdominal tenderness. There is no guarding or rebound.  Lymphadenopathy:     Cervical: No cervical adenopathy.  Skin:    General: Skin is warm and dry.     Findings: No rash.  Neurological:     Mental Status: He is alert and oriented to person, place, and time.     Cranial Nerves: No cranial nerve deficit.     Assessment/Plan: 1. Visit for preventive health examination Depression screen negative. Health Maintenance reviewed. Preventive schedule discussed and handout given in AVS. Will obtain fasting labs today.  - CBC with Differential/Platelet - Comprehensive metabolic panel - Lipid panel  2. Screen for STD (sexually transmitted  disease) - HIV Antibody (routine testing w rflx) - RPR - Urine cytology ancillary only([place]) - HSV(herpes smplx)abs-1+2(IgG+IgM)-bld  3. ADHD (attention deficit hyperactivity disorder), inattentive type CSC updated today. Will obtain UDS. Continue current regimen. Follow-up 6 months. - Pain Mgmt, Profile 8 w/Conf, U  4. Need for immunization against influenza - Flu Vaccine QUAD 36+ mos IM   Piedad Climes, PA-C

## 2018-05-02 LAB — HSV-2 IGG SUPPLEMENTAL TEST: HSV-2 IgG Supplemental Test: POSITIVE — AB

## 2018-05-02 LAB — HSV(HERPES SMPLX)ABS-I+II(IGG+IGM)-BLD
HSV 1 Glycoprotein G Ab, IgG: <0.91 index (ref 0.00–0.90)
HSV 2 IGG, TYPE SPEC: 1.54 {index} — AB (ref 0.00–0.90)
HSVI/II Comb IgM: <0.91 Ratio (ref 0.00–0.90)

## 2018-05-02 LAB — URINE CYTOLOGY ANCILLARY ONLY
CHLAMYDIA, DNA PROBE: NEGATIVE
Neisseria Gonorrhea: NEGATIVE

## 2018-05-03 LAB — PAIN MGMT, PROFILE 8 W/CONF, U
6 ACETYLMORPHINE: NEGATIVE ng/mL (ref <10)
AMPHETAMINES: POSITIVE ng/mL — AB (ref <500)
Alcohol Metabolites: NEGATIVE ng/mL (ref <500)
Amphetamine: 8291 ng/mL — ABNORMAL HIGH (ref <250)
Benzodiazepines: NEGATIVE ng/mL (ref <100)
Buprenorphine, Urine: NEGATIVE ng/mL (ref <5)
Cocaine Metabolite: NEGATIVE ng/mL (ref <150)
Creatinine: 209.9 mg/dL
MDMA: NEGATIVE ng/mL (ref <500)
METHAMPHETAMINE: NEGATIVE ng/mL (ref <250)
Marijuana Metabolite: NEGATIVE ng/mL (ref <20)
OXIDANT: NEGATIVE ug/mL (ref <200)
Opiates: NEGATIVE ng/mL (ref <100)
Oxycodone: NEGATIVE ng/mL (ref <100)
pH: 6.59 (ref 4.5–9.0)

## 2018-05-03 LAB — HIV ANTIBODY (ROUTINE TESTING W REFLEX)

## 2018-05-03 LAB — RPR

## 2018-05-07 ENCOUNTER — Telehealth: Payer: Self-pay | Admitting: Physician Assistant

## 2018-05-07 ENCOUNTER — Other Ambulatory Visit: Payer: Self-pay | Admitting: Physician Assistant

## 2018-05-07 NOTE — Telephone Encounter (Signed)
Copied from CRM [id number]. Topic: Quick Communication - Rx Refill/Question >> May 07, 2018  2:35 PM Arlyss Gandy, NT wrote: Medication: VYVANSE 40 MG capsule  Has the patient contacted their pharmacy? Yes.   (Agent: If no, request that the patient contact the pharmacy for the refill.) (Agent: If yes, when and what did the pharmacy advise?)  Preferred Pharmacy (with phone number or street name): [id number] [place] [place] - [address] [phone] (Phone) [phone] (Fax)    Agent: Please be advised that RX refills may take up to 3 business days. We ask that you follow-up with your pharmacy.

## 2018-05-07 NOTE — Telephone Encounter (Signed)
Requested medication (s) are due for refill today: yes  Requested medication (s) are on the active medication list: yes  Last refill:  04/09/17  Future visit scheduled: no  Notes to clinic:  Not delegated    Requested Prescriptions  Pending Prescriptions Disp Refills   VYVANSE 40 MG capsule 30 capsule 0    Sig: TAKE 1 CAPSULE BY MOUTH DAIL     Not Delegated - Psychiatry:  Stimulants/ADHD Failed - 05/07/2018  2:38 PM      Failed - This refill cannot be delegated      Passed - Urine Drug Screen completed in last 360 days.      Passed - Valid encounter within last 3 months    Recent Outpatient Visits          1 week ago Visit for preventive health examination   [facility], [place], [place]   6 months ago ADHD (attention deficit hyperactivity disorder), inattentive type   [facility], [place], [place]   1 year ago Acute bacterial bronchitis   [facility], Malachi Bonds, [place]   1 year ago ADHD (attention deficit hyperactivity disorder), inattentive type   [facility], [place] C, [place]

## 2018-05-07 NOTE — Telephone Encounter (Signed)
Copied from CRM [id number]. Topic: Quick Communication - Lab Results (Clinic Use ONLY) >> May 07, 2018  2:23 PM Con Memos, CMA wrote: Called patient to inform them of lab results. When patient returns call, triage nurse may disclose results. >> May 07, 2018  2:39 PM Arlyss Gandy, NT wrote: Pt calling back to receive lab results.

## 2018-05-07 NOTE — Telephone Encounter (Signed)
Left VM to CB for results; in result notes

## 2018-05-09 LAB — RPR QUALITATIVE: RPR: NONREACTIVE

## 2018-05-09 LAB — SPECIMEN STATUS REPORT

## 2018-05-10 MED ORDER — VYVANSE 40 MG PO CAPS: ORAL_CAPSULE | ORAL | 0 refills | Status: DC

## 2018-05-21 ENCOUNTER — Other Ambulatory Visit: Payer: BC Managed Care – PPO

## 2018-06-05 ENCOUNTER — Other Ambulatory Visit: Payer: Self-pay | Admitting: Physician Assistant

## 2018-06-06 ENCOUNTER — Other Ambulatory Visit: Payer: Self-pay | Admitting: Physician Assistant

## 2018-06-06 NOTE — Telephone Encounter (Signed)
Your pt

## 2018-06-06 NOTE — Telephone Encounter (Signed)
Last Filled: 05/10/2018 #30, 0 Last OV: 04/30/2018

## 2018-06-07 ENCOUNTER — Other Ambulatory Visit: Payer: Self-pay | Admitting: Physician Assistant

## 2018-06-07 ENCOUNTER — Telehealth: Payer: Self-pay | Admitting: Physician Assistant

## 2018-06-07 MED ORDER — VYVANSE 40 MG PO CAPS: ORAL_CAPSULE | ORAL | 0 refills | Status: DC

## 2018-06-07 NOTE — Telephone Encounter (Signed)
Refill request has already been routed to provider.

## 2018-06-07 NOTE — Telephone Encounter (Signed)
Requested medication (s) are due for refill today: yes  Requested medication (s) are on the active medication list: yes  Last refill:   05/07/2018  Future visit scheduled: no  Notes to clinic:  Not delegated    Requested Prescriptions  Pending Prescriptions Disp Refills   VYVANSE 40 MG capsule 30 capsule 0    Sig: TAKE 1 CAPSULE BY MOUTH DAIL     Not Delegated - Psychiatry:  Stimulants/ADHD Failed - 06/06/2018  7:25 PM      Failed - This refill cannot be delegated      Passed - Urine Drug Screen completed in last 360 days.      Passed - Valid encounter within last 3 months    Recent Outpatient Visits          1 month ago Visit for preventive health examination   [facility], [place], [place]   7 months ago ADHD (attention deficit hyperactivity disorder), inattentive type   [facility], [place], [place]   1 year ago Acute bacterial bronchitis   [facility], Malachi Bonds, [place]   1 year ago ADHD (attention deficit hyperactivity disorder), inattentive type   [facility], [place], [place]

## 2018-06-07 NOTE — Telephone Encounter (Signed)
Copied from CRM [id number]. Topic: Quick Communication - Rx Refill/Question >> Jun 07, 2018 11:20 AM Jens Som A wrote: Medication: VYVANSE 40 MG capsule [[id number]]   Has the patient contacted their pharmacy? Yes (Agent: If no, request that the patient contact the pharmacy for the refill.) (Agent: If yes, when and what did the pharmacy advise?)  Preferred Pharmacy (with phone number or street name): [id number] [place], [place] - [address] [phone] (Phone) [phone] (Fax)    Agent: Please be advised that RX refills may take up to 3 business days. We ask that you follow-up with your pharmacy.

## 2018-07-02 ENCOUNTER — Telehealth: Payer: Self-pay | Admitting: Physician Assistant

## 2018-07-02 MED ORDER — VYVANSE 40 MG PO CAPS: ORAL_CAPSULE | ORAL | 0 refills | Status: DC

## 2018-07-02 NOTE — Telephone Encounter (Signed)
Medications refilled

## 2018-07-02 NOTE — Telephone Encounter (Signed)
Copied from CRM [id number]. Topic: Quick Communication - Rx Refill/Question >> Jul 02, 2018  2:15 PM Jaquita Rector A wrote: Medication: VYVANSE 40 MG capsule   Has the patient contacted their pharmacy? Yes.   (Agent: If no, request that the patient contact the pharmacy for the refill.) (Agent: If yes, when and what did the pharmacy advise?)  Preferred Pharmacy (with phone number or street name): [id number] [place], [place] - [address] [phone] (Phone) [phone] (Fax)    Agent: Please be advised that RX refills may take up to 3 business days. We ask that you follow-up with your pharmacy.

## 2018-07-30 ENCOUNTER — Telehealth: Payer: Self-pay | Admitting: Physician Assistant

## 2018-07-30 MED ORDER — VYVANSE 40 MG PO CAPS: ORAL_CAPSULE | ORAL | 0 refills | Status: DC

## 2018-07-30 NOTE — Telephone Encounter (Signed)
Refill sent.

## 2018-07-30 NOTE — Telephone Encounter (Signed)
Pt called in asking for a refill on the [facility]se [facility] [place]

## 2018-07-30 NOTE — Telephone Encounter (Signed)
Vyvanse last rx 07/02/18 #30  LOV: 04/30/18 CPE CSC: 04/30/18 UDS: 04/30/18

## 2018-07-31 ENCOUNTER — Telehealth: Payer: Self-pay | Admitting: Emergency Medicine

## 2018-07-31 NOTE — Telephone Encounter (Signed)
Spoke with patient about rx was already sent to the pharmacy by PCP. Per PCP can call the pharmacy to give the go ahead to fill the Vyvanse rx early.  Patient will get settled in [facility] and find a new PCP.  [facility]03[facility]18    Copied from CRM #[id number]. Topic: General - Other >> Jul 31, 2018 10:12 AM Mitchell Fry wrote:  Mitchell Fry call to ask if he can pick up his RX on 08/01/2018 because he is moving to [place] . Pharmacy told Mitchell Fry they can not refill the RX  till 08/03/2018

## 2018-08-26 ENCOUNTER — Telehealth: Payer: Self-pay | Admitting: Physician Assistant

## 2018-08-26 NOTE — Telephone Encounter (Signed)
Please advise on refill.

## 2018-08-26 NOTE — Telephone Encounter (Signed)
Pt last seen Mitchell Fry in feb 2020. Pt has not moved fully to [facility] . Pt would like new rx vyvanse 40 mg. [facility] [place] [facility] . Phone number [phone]

## 2018-08-27 ENCOUNTER — Ambulatory Visit (INDEPENDENT_AMBULATORY_CARE_PROVIDER_SITE_OTHER): Payer: BC Managed Care – PPO | Admitting: Physician Assistant

## 2018-08-27 ENCOUNTER — Encounter: Payer: Self-pay | Admitting: Physician Assistant

## 2018-08-27 DIAGNOSIS — F900 Attention-deficit hyperactivity disorder, predominantly inattentive type: Secondary | ICD-10-CM | POA: Diagnosis not present

## 2018-08-27 MED ORDER — VYVANSE 40 MG PO CAPS: ORAL_CAPSULE | ORAL | 0 refills | Status: DC

## 2018-08-27 NOTE — Assessment & Plan Note (Signed)
Stable.  PDMP reviewed. No red flags. He is changing PCP so will not update CSC at this time as we will not be prescribing medications much longer. Refill sent in to new pharmacy for patient.

## 2018-08-27 NOTE — Progress Notes (Signed)
   Virtual Visit via Video   I connected with patient on 08/27/18 at  2:00 PM EDT by a video enabled telemedicine application and verified that I am speaking with the correct person using two identifiers.  Location patient: Home Location provider: [place], Office Persons participating in the virtual visit: Patient, Provider, PA-Student Anibal Henderson, CMA (Eduard Clos)  I discussed the limitations of evaluation and management by telemedicine and the availability of in person appointments. The patient expressed understanding and agreed to proceed.  Subjective:   HPI:   Patient presents today for follow-up of ADD. Patient is currently on a regimen of Vyvanse 40 mg daily. Endorses taking medications daily as directed. Has done very well on this regimen for quite some time. Notes medication is still helping greatly with focus. Denies issue currently with sleep (is taking Melatonin currently). Denies anorexia.  Patient denies chest pain, palpitations, lightheadedness, dizziness, vision changes or frequent headaches.   ROS:   See pertinent positives and negatives per HPI.  Patient Active Problem List   Diagnosis Date Noted  . History of alcohol abuse 03/27/2017  . ADHD (attention deficit hyperactivity disorder), inattentive type 09/03/2013    Social History   Tobacco Use  . Smoking status: Former Smoker    Packs/day: 1.00    Years: 4.00    Pack years: 4.00    Types: Cigarettes    Quit date: 08/25/2016    Years since quitting: 2.0  . Smokeless tobacco: Never Used  Substance Use Topics  . Alcohol use: No    Frequency: Never    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    Current Outpatient Medications:  .  VYVANSE 40 MG capsule, TAKE 1 CAPSULE BY MOUTH DAIL, Disp: 30 capsule, Rfl: 0  No Known Allergies  Objective:   There were no vitals taken for this visit.  Patient is well-developed, well-nourished in no acute distress.  No labored breathing.  Speech is clear  and coherent with logical contest.  Patient is alert and oriented at baseline.    Assessment and Plan:   ADHD (attention deficit hyperactivity disorder), inattentive type Stable.  PDMP reviewed. No red flags. He is changing PCP so will not update CSC at this time as we will not be prescribing medications much longer. Refill sent in to new pharmacy for patient.     Leeanne Rio, [place] 08/27/2018

## 2018-08-27 NOTE — Telephone Encounter (Signed)
PCP has not problem filling medication but unable to send to another state. Since patient is moving to [facility] he will need to establish with a new PCP there.

## 2018-08-27 NOTE — Progress Notes (Signed)
I have discussed the procedure for the virtual visit with the patient who has given consent to proceed with assessment and treatment.   Angalena Cousineau S Burnard Enis, CMA     

## 2018-09-23 ENCOUNTER — Other Ambulatory Visit: Payer: Self-pay | Admitting: Physician Assistant

## 2018-09-23 DIAGNOSIS — F900 Attention-deficit hyperactivity disorder, predominantly inattentive type: Secondary | ICD-10-CM

## 2018-09-23 MED ORDER — VYVANSE 40 MG PO CAPS: ORAL_CAPSULE | ORAL | 0 refills | Status: DC

## 2018-09-23 NOTE — Telephone Encounter (Signed)
I will allow 1 more refill but will have to be an [place]. Which would he like sent to? This will be last fill.

## 2018-09-23 NOTE — Telephone Encounter (Signed)
Pt called in asking if [name] would be willing to refill Vyvanse. He states that he just got settled in [facility] and has yet to find a pcp. Please advise.

## 2018-09-23 NOTE — Telephone Encounter (Signed)
LMOVM advising [facility]se [facility].   Vyvanse rx last filled 08/27/18 #[facility] [place] [place]

## 2018-09-23 NOTE — Addendum Note (Signed)
Addended by: Leonidas Romberg on: 09/23/2018 04:30 PM   Modules accepted: Orders

## 2018-09-23 NOTE — Telephone Encounter (Signed)
The pharmacy on file in [place] was confirmed by the Pt

## 2018-10-10 DIAGNOSIS — J301 Allergic rhinitis due to pollen: Secondary | ICD-10-CM | POA: Insufficient documentation
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
## 2020-12-29 DIAGNOSIS — S68111A Complete traumatic metacarpophalangeal amputation of left index finger, initial encounter: Secondary | ICD-10-CM | POA: Insufficient documentation

## 2021-01-03 DIAGNOSIS — M25642 Stiffness of left hand, not elsewhere classified: Secondary | ICD-10-CM | POA: Insufficient documentation

## 2021-01-03 DIAGNOSIS — M79642 Pain in left hand: Secondary | ICD-10-CM | POA: Insufficient documentation

## 2021-05-13 ENCOUNTER — Ambulatory Visit: Payer: BC Managed Care – PPO | Admitting: Nurse Practitioner

## 2021-05-13 ENCOUNTER — Other Ambulatory Visit: Payer: Self-pay

## 2021-05-13 ENCOUNTER — Encounter: Payer: Self-pay | Admitting: Nurse Practitioner

## 2021-05-13 VITALS — BP 120/70 | HR 55 | Ht 74.0 in | Wt 217.0 lb

## 2021-05-13 DIAGNOSIS — Z7689 Persons encountering health services in other specified circumstances: Secondary | ICD-10-CM | POA: Diagnosis not present

## 2021-05-13 DIAGNOSIS — E663 Overweight: Secondary | ICD-10-CM | POA: Diagnosis not present

## 2021-05-13 DIAGNOSIS — F900 Attention-deficit hyperactivity disorder, predominantly inattentive type: Secondary | ICD-10-CM

## 2021-05-13 NOTE — Progress Notes (Signed)
? ?New Patient Office Visit ? ?Subjective:  ?Patient ID: Mitchell Fry, male    DOB: 1996/01/11  Age: 26 y.o. MRN: [id number] ? ?CC:  ?Chief Complaint  ?Patient presents with  ? New Patient (Initial Visit)  ?  Patient here today to establish care. Patient has no complaints.   ? ? ?HPI ?Mitchell Fry presents for establishing care. Pt moved to [place] from [facility]. His PCP was Dr. Delcie Fry  at [facility] Over all he is doing well. He was diagnosed with ADHD as a child and is taking vyvanse  40 mg daily. He worked at [employer] and his finger accidentally caught in the grinder at this work. 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 He is doing better and no other concerns at present.  ? ?Past Medical History:  ?Diagnosis Date  ? ADHD   ? ? ?Past Surgical History:  ?Procedure Laterality Date  ? arm surgery    ? Had to be sedated to have arm reset  ? ? ?Family History  ?Problem Relation Age of Onset  ? Depression Mother   ? Depression Father   ? Mental illness Maternal Grandmother   ? Mental illness Maternal Grandfather   ? Mental illness Paternal Grandmother   ? Mental illness Paternal Grandfather   ? ? ?Social History  ? ?Socioeconomic History  ? Marital status: Single  ?  Spouse name: Not on file  ? Number of children: Not on file  ? Years of education: Not on file  ? Highest education level: Not on file  ?Occupational History  ? Occupation: Energy manager  ?Tobacco Use  ? Smoking status: Former  ?  Packs/day: 1.00  ?  Years: 4.00  ?  Pack years: 4.00  ?  Types: Cigarettes  ?  Quit date: 08/25/2016  ?  Years since quitting: 4.7  ? Smokeless tobacco: Never  ?Vaping Use  ? Vaping Use: Never used  ?Substance and Sexual Activity  ? Alcohol use: No  ?  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 Drug use: No  ? Sexual activity: Yes  ?  Partners: Female  ?Other Topics Concern  ? Not on file  ?Social History Narrative  ? Works at [employer]  ? Plays electric guitar in band  ? ?Social  Determinants of Health  ? ?Financial Resource Strain: Not on file  ?Food Insecurity: Not on file  ?Transportation Needs: Not on file  ?Physical Activity: Not on file  ?Stress: Not on file  ?Social Connections: Not on file  ?Intimate Partner Violence: Not on file  ? ? ?ROS ?Review of Systems  ?Constitutional:  Negative for activity change and appetite change.  ?HENT:  Negative for congestion and ear discharge.   ?Eyes: Negative.   ?Respiratory:  Negative for apnea and cough.   ?Cardiovascular:  Negative for chest pain and palpitations.  ?Gastrointestinal:  Negative for abdominal pain and constipation.  ?Genitourinary: Negative.   ?Musculoskeletal: Negative.   ?Skin: Negative.   ?Psychiatric/Behavioral:  Negative for agitation, behavioral problems and confusion.   ? ?Objective:  ? ?Today's Vitals: BP 120/70   Pulse (!) 55   Ht 6\' 2"  (1.88 m)   Wt 217 lb (98.4 kg)   BMI 27.86 kg/m?  ? ?Physical Exam ?Constitutional:   ?   Appearance: Normal appearance. He is obese.  ?HENT:  ?   Head: Normocephalic.  ?   Right Ear: Tympanic membrane normal.  ?   Left  Ear: Tympanic membrane normal.  ?   Nose: Nose normal.  ?   Mouth/Throat:  ?   Mouth: Mucous membranes are moist.  ?Eyes:  ?   Pupils: Pupils are equal, round, and reactive to light.  ?Cardiovascular:  ?   Rate and Rhythm: Normal rate and regular rhythm.  ?Pulmonary:  ?   Effort: Pulmonary effort is normal.  ?   Breath sounds: Normal breath sounds.  ?Abdominal:  ?   General: Abdomen is flat. Bowel sounds are normal.  ?   Palpations: Abdomen is soft.  ?Musculoskeletal:  ?   Cervical back: Normal range of motion and neck supple.  ?   Comments: Left index finger amputation   ?Skin: ?   General: Skin is warm.  ?   Capillary Refill: Capillary refill takes less than 2 seconds.  ?Neurological:  ?   General: No focal deficit present.  ?   Mental Status: He is alert and oriented to person, place, and time. Mental status is at baseline.  ?Psychiatric:     ?   Mood and Affect:  Mood normal.     ?   Behavior: Behavior normal.     ?   Thought Content: Thought content normal.     ?   Judgment: Judgment normal.  ? ? ?Assessment & Plan:  ? ?Problem List Items Addressed This Visit   ? ?  ? Other  ? ADHD (attention deficit hyperactivity disorder), inattentive type  ?  Stable at present with medication. ?  ?  ? Establishing care with new doctor, encounter for - Primary  ?  Screening labs ordered. ?  ?  ? Relevant Orders  ? COMPLETE METABOLIC PANEL WITH GFR  ? Basic metabolic panel  ? Lipid panel  ? Overweight (BMI 25.0-29.9)  ?  BMI 27.86 ?Advise pt to lose weight. ?Watch diet and follow regular exercise routine. ?  ?  ? ? ?Outpatient Encounter Medications as of 05/13/2021  ?Medication Sig  ? VYVANSE 40 MG capsule TAKE 1 CAPSULE BY MOUTH DAIL  ? ?No facility-administered encounter medications on file as of 05/13/2021.  ? ? ?Follow-up: No follow-ups on file.  ? ?Mitchell Dies, NP ? ?

## 2021-05-15 DIAGNOSIS — E663 Overweight: Secondary | ICD-10-CM | POA: Insufficient documentation

## 2021-05-15 NOTE — Assessment & Plan Note (Signed)
BMI 27.86 ?Advise pt to lose weight. ?Watch diet and follow regular exercise routine. ?

## 2021-05-15 NOTE — Assessment & Plan Note (Signed)
Screening labs ordered

## 2021-05-15 NOTE — Assessment & Plan Note (Signed)
Stable at present with medication. ?

## 2021-06-03 ENCOUNTER — Ambulatory Visit (INDEPENDENT_AMBULATORY_CARE_PROVIDER_SITE_OTHER): Payer: BC Managed Care – PPO

## 2021-06-03 DIAGNOSIS — Z7689 Persons encountering health services in other specified circumstances: Secondary | ICD-10-CM

## 2021-06-03 DIAGNOSIS — Z0000 Encounter for general adult medical examination without abnormal findings: Secondary | ICD-10-CM | POA: Diagnosis not present

## 2021-06-07 LAB — LIPID PANEL
Cholesterol: 167 mg/dL (ref <200)
HDL: 38 mg/dL — ABNORMAL LOW (ref >=40)
LDL Cholesterol (Calc): 102 mg/dL (calc) — ABNORMAL HIGH
Non-HDL Cholesterol (Calc): 129 mg/dL (calc) (ref <130)
Total CHOL/HDL Ratio: 4.4 (calc) (ref <5.0)
Triglycerides: 173 mg/dL — ABNORMAL HIGH (ref <150)

## 2021-06-07 LAB — COMPLETE METABOLIC PANEL WITH GFR
AG Ratio: 1.8 (calc) (ref 1.0–2.5)
ALT: 26 U/L (ref 9–46)
AST: 24 U/L (ref 10–40)
Albumin: 4.8 g/dL (ref 3.6–5.1)
Alkaline phosphatase (APISO): 88 U/L (ref 36–130)
BUN: 10 mg/dL (ref 7–25)
CO2: 26 mmol/L (ref 20–32)
Calcium: 9.7 mg/dL (ref 8.6–10.3)
Chloride: 105 mmol/L (ref 98–110)
Creat: 0.87 mg/dL (ref 0.60–1.24)
Globulin: 2.7 g/dL (calc) (ref 1.9–3.7)
Glucose, Bld: 95 mg/dL (ref 65–99)
Potassium: 4.7 mmol/L (ref 3.5–5.3)
Sodium: 138 mmol/L (ref 135–146)
Total Bilirubin: 0.3 mg/dL (ref 0.2–1.2)
Total Protein: 7.5 g/dL (ref 6.1–8.1)
eGFR: 123 mL/min/{1.73_m2} (ref >=60)

## 2021-06-07 LAB — EXTRA LAV TOP TUBE

## 2021-06-07 LAB — SPECIMEN COMPROMISED

## 2021-07-15 ENCOUNTER — Ambulatory Visit (INDEPENDENT_AMBULATORY_CARE_PROVIDER_SITE_OTHER): Payer: BC Managed Care – PPO | Admitting: Nurse Practitioner

## 2021-07-15 ENCOUNTER — Encounter: Payer: Self-pay | Admitting: Nurse Practitioner

## 2021-07-15 VITALS — BP 116/77 | HR 85 | Ht 74.0 in | Wt 213.0 lb

## 2021-07-15 DIAGNOSIS — E663 Overweight: Secondary | ICD-10-CM | POA: Diagnosis not present

## 2021-07-15 DIAGNOSIS — S68119A Complete traumatic metacarpophalangeal amputation of unspecified finger, initial encounter: Secondary | ICD-10-CM | POA: Insufficient documentation

## 2021-07-15 DIAGNOSIS — F900 Attention-deficit hyperactivity disorder, predominantly inattentive type: Secondary | ICD-10-CM | POA: Diagnosis not present

## 2021-07-15 DIAGNOSIS — S68119D Complete traumatic metacarpophalangeal amputation of unspecified finger, subsequent encounter: Secondary | ICD-10-CM

## 2021-07-15 MED ORDER — VYVANSE 40 MG PO CAPS: ORAL_CAPSULE | ORAL | 0 refills | Status: DC

## 2021-07-15 NOTE — Progress Notes (Signed)
? ?Established Patient Office Visit ? ?Subjective:  ?Patient ID: Mitchell Fry, male    DOB: [birth year]  Age: 26 y.o. MRN: [id number] ? ?CC:  ?Chief Complaint  ?Patient presents with  ? Medication Refill  ?  Patient is here for his medication refill.  ? Lab Results  ? ? ? ?HPI ? ?Mitchell Fry presents for lab review and refill of medication vyvanse 40 mg. He is back to work now after the incident at his work. He is doing well. No concerns at present. He sometimes uses melatonin for sleep.   ?Patient brought paper to be filled for discount coupon on vyvanse prescription. ? ?HPI  ? ?Past Medical History:  ?Diagnosis Date  ? ADHD   ? ? ?Past Surgical History:  ?Procedure Laterality Date  ? arm surgery    ? Had to be sedated to have arm reset  ? ? ?Family History  ?Problem Relation Age of Onset  ? Depression Mother   ? Depression Father   ? Mental illness Maternal Grandmother   ? Mental illness Maternal Grandfather   ? Mental illness Paternal Grandmother   ? Mental illness Paternal Grandfather   ? ? ?Social History  ? ?Socioeconomic History  ? Marital status: Single  ?  Spouse name: Not on file  ? Number of children: Not on file  ? Years of education: Not on file  ? Highest education level: Not on file  ?Occupational History  ? Occupation: Furniture conservator/restorer  ?Tobacco Use  ? Smoking status: Former  ?  Packs/day: 1.00  ?  Years: 4.00  ?  Pack years: 4.00  ?  Types: Cigarettes  ?  Quit date: 08/25/2016  ?  Years since quitting: 4.8  ? Smokeless tobacco: Never  ?Vaping Use  ? Vaping Use: Never used  ?Substance and Sexual Activity  ? Alcohol use: No  ?  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 Drug use: No  ? Sexual activity: Yes  ?  Partners: Female  ?Other Topics Concern  ? Not on file  ?Social History Narrative  ? Works at [employer]  ? Plays electric guitar in band  ? ?Social Determinants of Health  ? ?Financial Resource Strain: Not on file  ?Food Insecurity: Not on file  ?Transportation Needs: Not on file  ?Physical  Activity: Not on file  ?Stress: Not on file  ?Social Connections: Not on file  ?Intimate Partner Violence: Not on file  ? ? ? ?Outpatient Medications Prior to Visit  ?Medication Sig Dispense Refill  ? VYVANSE 40 MG capsule TAKE 1 CAPSULE BY MOUTH DAIL 30 capsule 0  ? ?No facility-administered medications prior to visit.  ? ? ?No Known Allergies ? ?ROS ?Review of Systems  ?Constitutional:  Negative for activity change and fatigue.  ?HENT:  Negative for congestion, hearing loss and tinnitus.   ?Eyes:  Negative for discharge and redness.  ?Respiratory:  Negative for apnea, cough and wheezing.   ?Cardiovascular:  Negative for chest pain and palpitations.  ?Gastrointestinal:  Negative for abdominal distention, blood in stool and nausea.  ?Genitourinary:  Negative for difficulty urinating, flank pain and hematuria.  ?Musculoskeletal:  Negative for arthralgias, gait problem and neck pain.  ?Skin:  Negative for color change, pallor and rash.  ?Neurological:  Negative for dizziness, facial asymmetry and headaches.  ?Psychiatric/Behavioral:  Negative for agitation, behavioral problems, confusion and decreased concentration.   ? ?  ?Objective:  ?  ?Physical Exam ?Constitutional:   ?   Appearance:  He is overweight.  ?HENT:  ?   Head: Normocephalic and atraumatic.  ?   Right Ear: Tympanic membrane normal.  ?   Left Ear: Tympanic membrane normal.  ?   Nose: Nose normal.  ?   Mouth/Throat:  ?   Mouth: Mucous membranes are moist.  ?   Pharynx: Oropharynx is clear.  ?Eyes:  ?   Conjunctiva/sclera: Conjunctivae normal.  ?   Pupils: Pupils are equal, round, and reactive to light.  ?Cardiovascular:  ?   Rate and Rhythm: Normal rate and regular rhythm.  ?   Pulses: Normal pulses.  ?   Heart sounds: Normal heart sounds.  ?Pulmonary:  ?   Effort: Pulmonary effort is normal.  ?   Breath sounds: Normal breath sounds.  ?Abdominal:  ?   General: Abdomen is flat. Bowel sounds are normal.  ?   Palpations: Abdomen is soft.  ?Musculoskeletal:      ?   General: Normal range of motion.  ?   Cervical back: Normal range of motion.  ?   Comments: Left index finger amputation.  ?Skin: ?   General: Skin is warm.  ?   Capillary Refill: Capillary refill takes less than 2 seconds.  ?Neurological:  ?   General: No focal deficit present.  ?   Mental Status: He is alert and oriented to person, place, and time. Mental status is at baseline.  ?Psychiatric:     ?   Mood and Affect: Mood normal.     ?   Behavior: Behavior normal.     ?   Thought Content: Thought content normal.  ? ? ?BP 116/77   Pulse 85   Ht '6\' 2"'  (1.88 m)   Wt 213 lb (96.6 kg)   BMI 27.35 kg/m?  ?Wt Readings from Last 3 Encounters:  ?07/15/21 213 lb (96.6 kg)  ?05/13/21 217 lb (98.4 kg)  ?04/30/18 170 lb (77.1 kg)  ? ? ? ?Health Maintenance Due  ?Topic Date Due  ? COVID-19 Vaccine (1) Never done  ? Hepatitis C Screening  Never done  ? ? ?There are no preventive care reminders to display for this patient. ? ?No results found for: TSH ?Lab Results  ?Component Value Date  ? WBC 4.2 04/30/2018  ? HGB 15.9 04/30/2018  ? HCT 46.7 04/30/2018  ? MCV 92.3 04/30/2018  ? PLT 287.0 04/30/2018  ? ?Lab Results  ?Component Value Date  ? NA 138 06/03/2021  ? K 4.7 06/03/2021  ? CO2 26 06/03/2021  ? GLUCOSE 95 06/03/2021  ? BUN 10 06/03/2021  ? CREATININE 0.87 06/03/2021  ? BILITOT 0.3 06/03/2021  ? ALKPHOS 84 04/30/2018  ? AST 24 06/03/2021  ? ALT 26 06/03/2021  ? PROT 7.5 06/03/2021  ? ALBUMIN 4.7 04/30/2018  ? CALCIUM 9.7 06/03/2021  ? EGFR 123 06/03/2021  ? GFR 127.94 04/30/2018  ? ?Lab Results  ?Component Value Date  ? CHOL 167 06/03/2021  ? ?Lab Results  ?Component Value Date  ? HDL 38 (L) 06/03/2021  ? ?Lab Results  ?Component Value Date  ? LDLCALC 102 (H) 06/03/2021  ? ?Lab Results  ?Component Value Date  ? TRIG 173 (H) 06/03/2021  ? ?Lab Results  ?Component Value Date  ? CHOLHDL 4.4 06/03/2021  ? ?No results found for: HGBA1C ? ?  ?Assessment & Plan:  ? ?Problem List Items Addressed This Visit   ? ?  ? Other   ? ADHD (attention deficit hyperactivity disorder), inattentive type  ?  Stable with medication. ?Filled paper work discount coupon for vyvanse prescription.  ? ? ? ?  ?  ? Overweight (BMI 25.0-29.9) - Primary  ?  BMI 27.35 ?Advised pt to lose weight. ?Advised patient to avoid trans fat, fatty and fried food. ?Follow a regular physical activity schedule. ?Went over the risk of chronic diseases with increased weight.   ? ? ? ?  ?  ? Amputation finger  ?  Amputation of left index finger. ?Doing fine at present.  ? ?  ?  ? ? ? ?No orders of the defined types were placed in this encounter. ? ? ? ?Follow-up: No follow-ups on file.  ? ? ?Theresia Lo, NP ?

## 2021-07-15 NOTE — Assessment & Plan Note (Signed)
BMI 27.35 ?Advised pt to lose weight. ?Advised patient to avoid trans fat, fatty and fried food. ?Follow a regular physical activity schedule. ?Went over the risk of chronic diseases with increased weight.   ? ? ? ?

## 2021-07-15 NOTE — Assessment & Plan Note (Signed)
Stable with medication. ?Filled paper work discount coupon for vyvanse prescription.  ? ? ?

## 2021-07-15 NOTE — Assessment & Plan Note (Signed)
Amputation of left index finger. ?Doing fine at present.  ?

## 2021-08-12 ENCOUNTER — Ambulatory Visit (INDEPENDENT_AMBULATORY_CARE_PROVIDER_SITE_OTHER): Payer: BC Managed Care – PPO | Admitting: Nurse Practitioner

## 2021-08-12 ENCOUNTER — Encounter: Payer: Self-pay | Admitting: Nurse Practitioner

## 2021-08-12 VITALS — BP 130/78 | HR 69 | Ht 74.0 in | Wt 213.0 lb

## 2021-08-12 DIAGNOSIS — F900 Attention-deficit hyperactivity disorder, predominantly inattentive type: Secondary | ICD-10-CM

## 2021-08-12 DIAGNOSIS — E663 Overweight: Secondary | ICD-10-CM | POA: Diagnosis not present

## 2021-08-12 MED ORDER — VYVANSE 40 MG PO CAPS: ORAL_CAPSULE | ORAL | 0 refills | Status: DC

## 2021-08-12 NOTE — Assessment & Plan Note (Signed)
Stable with medication. Continue Vyvanse 40 mg daily.

## 2021-08-12 NOTE — Assessment & Plan Note (Signed)
Body mass index is 27.35 kg/m. Advised pt to lose weight and follow a regular physical activity schedule. Advised patient to avoid trans fat, fatty and fried food.

## 2021-08-12 NOTE — Progress Notes (Signed)
Established Patient Office Visit  Subjective:  Patient ID: Mitchell Fry, male    DOB: [birth year]  Age: 26 y.o. MRN: [id number]  CC:  Chief Complaint  Patient presents with   Follow-up     HPI  Mitchell Fry presents for refill of medication vyvanse 40 mg. He is doing well With his ADHD with the medication. No concerns at present.   Past Medical History:  Diagnosis Date   ADHD     Past Surgical History:  Procedure Laterality Date   arm surgery     Had to be sedated to have arm reset    Family History  Problem Relation Age of Onset   Depression Mother    Depression Father    Mental illness Maternal Grandmother    Mental illness Maternal Grandfather    Mental illness Paternal Grandmother    Mental illness Paternal Grandfather     Social History   Socioeconomic History   Marital status: Single    Spouse name: Not on file   Number of children: Not on file   Years of education: Not on file   Highest education level: Not on file  Occupational History   Occupation: Furniture conservator/restorer  Tobacco Use   Smoking status: Former    Packs/day: 1.00    Years: 4.00    Total pack years: 4.00    Types: Cigarettes    Quit date: 08/25/2016    Years since quitting: 4.9   Smokeless tobacco: Never  Vaping Use   Vaping Use: Never used  Substance and Sexual Activity   Alcohol use: No    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   Drug use: No   Sexual activity: Yes    Partners: Female  Other Topics Concern   Not on file  Social History Narrative   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    No Known Allergies  ROS Review of Systems  Constitutional:  Negative for activity change and fatigue.  HENT:  Negative for congestion, hearing loss and tinnitus.   Eyes:  Negative for discharge and redness.  Respiratory:  Negative for apnea, cough and wheezing.   Cardiovascular:  Negative for chest pain and palpitations.  Gastrointestinal:  Negative for abdominal distention, blood in stool and nausea.  Genitourinary:  Negative for difficulty urinating, flank pain and hematuria.  Musculoskeletal:  Negative for arthralgias, gait problem and neck pain.  Skin:  Negative for color change, pallor and rash.  Neurological:  Negative for dizziness, facial asymmetry and headaches.  Psychiatric/Behavioral:  Negative for agitation, behavioral problems, confusion and decreased concentration.       Objective:    Physical Exam Constitutional:      Appearance: He is overweight.  HENT:     Head: Normocephalic and atraumatic.     Right Ear: Tympanic membrane normal.     Left Ear: Tympanic membrane normal.     Nose: Nose normal.     Mouth/Throat:  Mouth: Mucous membranes are moist.     Pharynx: Oropharynx is clear.  Eyes:     Conjunctiva/sclera: Conjunctivae normal.     Pupils: Pupils are equal, round, and reactive to light.  Cardiovascular:     Rate and Rhythm: Normal rate and regular rhythm.     Pulses: Normal pulses.     Heart sounds: Normal heart sounds.  Pulmonary:     Effort: Pulmonary effort is normal.     Breath sounds: Normal breath sounds.  Abdominal:     General: Abdomen is flat. Bowel sounds are normal.     Palpations: Abdomen is soft.  Musculoskeletal:        General: Normal range of motion.     Cervical back: Normal range of motion.     Comments: Left index finger amputation.  Skin:    General: Skin is warm.     Capillary Refill: Capillary refill takes less than 2 seconds.   Neurological:     General: No focal deficit present.     Mental Status: He is alert and oriented to person, place, and time. Mental status is at baseline.  Psychiatric:        Mood and Affect: Mood normal.        Behavior: Behavior normal.        Thought Content: Thought content normal.     BP 130/78   Pulse 69   Ht '6\' 2"'  (1.88 m)   Wt 213 lb (96.6 kg)   BMI 27.35 kg/m  Wt Readings from Last 3 Encounters:  08/12/21 213 lb (96.6 kg)  07/15/21 213 lb (96.6 kg)  05/13/21 217 lb (98.4 kg)     Health Maintenance Due  Topic Date Due   COVID-19 Vaccine (1) Never done   Hepatitis C Screening  Never done    There are no preventive care reminders to display for this patient.  No results found for: "TSH" Lab Results  Component Value Date   WBC 4.2 04/30/2018   HGB 15.9 04/30/2018   HCT 46.7 04/30/2018   MCV 92.3 04/30/2018   PLT 287.0 04/30/2018   Lab Results  Component Value Date   NA 138 06/03/2021   K 4.7 06/03/2021   CO2 26 06/03/2021   GLUCOSE 95 06/03/2021   BUN 10 06/03/2021   CREATININE 0.87 06/03/2021   BILITOT 0.3 06/03/2021   ALKPHOS 84 04/30/2018   AST 24 06/03/2021   ALT 26 06/03/2021   PROT 7.5 06/03/2021   ALBUMIN 4.7 04/30/2018   CALCIUM 9.7 06/03/2021   EGFR 123 06/03/2021   GFR 127.94 04/30/2018   Lab Results  Component Value Date   CHOL 167 06/03/2021   Lab Results  Component Value Date   HDL 38 (L) 06/03/2021   Lab Results  Component Value Date   LDLCALC 102 (H) 06/03/2021   Lab Results  Component Value Date   TRIG 173 (H) 06/03/2021   Lab Results  Component Value Date   CHOLHDL 4.4 06/03/2021   No results found for: "HGBA1C"    Assessment & Plan:   Problem List Items Addressed This Visit       Other   ADHD (attention deficit hyperactivity disorder), inattentive type - Primary    Stable with medication. Continue Vyvanse 40 mg daily.      Relevant Medications   VYVANSE 40 MG capsule   Overweight (BMI 25.0-29.9)     Body mass index is 27.35 kg/m. Advised pt to lose weight and follow a regular  physical activity schedule. Advised patient to avoid trans fat, fatty and fried food.               Meds ordered this encounter  Medications   VYVANSE 40 MG capsule    Sig: TAKE 1 CAPSULE BY MOUTH DAIL    Dispense:  30 capsule    Refill:  0     Follow-up: Return in about 1 month (around 09/11/2021).    Theresia Lo, NP

## 2021-09-16 ENCOUNTER — Ambulatory Visit: Payer: BC Managed Care – PPO | Admitting: Nurse Practitioner

## 2021-09-23 ENCOUNTER — Ambulatory Visit (INDEPENDENT_AMBULATORY_CARE_PROVIDER_SITE_OTHER): Payer: BC Managed Care – PPO | Admitting: Nurse Practitioner

## 2021-09-23 ENCOUNTER — Encounter: Payer: Self-pay | Admitting: Nurse Practitioner

## 2021-09-23 DIAGNOSIS — F900 Attention-deficit hyperactivity disorder, predominantly inattentive type: Secondary | ICD-10-CM

## 2021-09-23 NOTE — Progress Notes (Signed)
Established Patient Office Visit  Subjective:  Patient ID: Mitchell Fry, male    DOB: 1995/11/13  Age: 26 y.o. MRN: [id number]  CC:  Chief Complaint  Patient presents with   Medication Refill    HPI  Mitchell Fry presents for refill of medication.  He has history of ADHD and is taking Vyvanse 40 mg. He is doing fine and no concerns today.   Past Medical History:  Diagnosis Date   ADHD     Past Surgical History:  Procedure Laterality Date   arm surgery     Had to be sedated to have arm reset    Family History  Problem Relation Age of Onset   Depression Mother    Depression Father    Mental illness Maternal Grandmother    Mental illness Maternal Grandfather    Mental illness Paternal Grandmother    Mental illness Paternal Grandfather     Social History   Socioeconomic History   Marital status: Single    Spouse name: Not on file   Number of children: Not on file   Years of education: Not on file   Highest education level: Not on file  Occupational History   Occupation: Energy manager  Tobacco Use   Smoking status: Former    Packs/day: 1.00    Years: 4.00    Total pack years: 4.00    Types: Cigarettes    Quit date: 08/25/2016    Years since quitting: 5.0   Smokeless tobacco: Never  Vaping Use   Vaping Use: Never used  Substance and Sexual Activity   Alcohol use: No    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   Drug use: No   Sexual activity: Yes    Partners: Female  Other Topics Concern   Not on file  Social History Narrative   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    No Known Allergies  ROS Review of Systems  Constitutional:  Negative for activity change and fatigue.  HENT:  Negative for congestion, hearing loss and tinnitus.   Eyes:  Negative for discharge and redness.  Respiratory:  Negative for apnea, cough and wheezing.   Cardiovascular:  Negative for chest pain and palpitations.  Gastrointestinal:  Negative for abdominal distention, blood in stool and nausea.  Genitourinary:  Negative for difficulty urinating, flank pain and hematuria.  Musculoskeletal:  Negative for arthralgias, gait problem and neck pain.  Skin:  Negative for color change, pallor and rash.  Neurological:  Negative for dizziness, facial asymmetry and headaches.  Psychiatric/Behavioral:  Negative for agitation, behavioral problems, confusion and decreased concentration.       Objective:    Physical Exam Constitutional:      Appearance: He is overweight.  HENT:     Head: Normocephalic and atraumatic.     Right Ear: Tympanic membrane normal.     Left Ear: Tympanic membrane normal.     Nose: Nose normal.  Mouth/Throat:     Mouth: Mucous membranes are moist.     Pharynx: Oropharynx is clear.  Eyes:     Conjunctiva/sclera: Conjunctivae normal.     Pupils: Pupils are equal, round, and reactive to light.  Cardiovascular:     Rate and Rhythm: Normal rate and regular rhythm.     Pulses: Normal pulses.     Heart sounds: Normal heart sounds.  Pulmonary:     Effort: Pulmonary effort is normal.     Breath sounds: Normal breath sounds.  Abdominal:     General: Abdomen is flat. Bowel sounds are normal.     Palpations: Abdomen is soft.  Musculoskeletal:        General: Normal range of motion.     Cervical back: Normal range of motion.     Comments: Left index finger amputation.  Skin:    General: Skin is warm.     Capillary Refill: Capillary refill takes less than 2  seconds.  Neurological:     General: No focal deficit present.     Mental Status: He is alert and oriented to person, place, and time. Mental status is at baseline.  Psychiatric:        Mood and Affect: Mood normal.        Behavior: Behavior normal.        Thought Content: Thought content normal.     BP 125/72   Pulse 86   Ht 6\' 2"  (1.88 m)   Wt 214 lb 8 oz (97.3 kg)   BMI 27.54 kg/m  Wt Readings from Last 3 Encounters:  09/23/21 214 lb 8 oz (97.3 kg)  08/12/21 213 lb (96.6 kg)  07/15/21 213 lb (96.6 kg)     Health Maintenance Due  Topic Date Due   COVID-19 Vaccine (1) Never done   Hepatitis C Screening  Never done    There are no preventive care reminders to display for this patient.  No results found for: "TSH" Lab Results  Component Value Date   WBC 4.2 04/30/2018   HGB 15.9 04/30/2018   HCT 46.7 04/30/2018   MCV 92.3 04/30/2018   PLT 287.0 04/30/2018   Lab Results  Component Value Date   NA 138 06/03/2021   K 4.7 06/03/2021   CO2 26 06/03/2021   GLUCOSE 95 06/03/2021   BUN 10 06/03/2021   CREATININE 0.87 06/03/2021   BILITOT 0.3 06/03/2021   ALKPHOS 84 04/30/2018   AST 24 06/03/2021   ALT 26 06/03/2021   PROT 7.5 06/03/2021   ALBUMIN 4.7 04/30/2018   CALCIUM 9.7 06/03/2021   EGFR 123 06/03/2021   GFR 127.94 04/30/2018   Lab Results  Component Value Date   CHOL 167 06/03/2021   Lab Results  Component Value Date   HDL 38 (L) 06/03/2021   Lab Results  Component Value Date   LDLCALC 102 (H) 06/03/2021   Lab Results  Component Value Date   TRIG 173 (H) 06/03/2021   Lab Results  Component Value Date   CHOLHDL 4.4 06/03/2021   No results found for: "HGBA1C"    Assessment & Plan:   Problem List Items Addressed This Visit       Other   ADHD (attention deficit hyperactivity disorder), inattentive type    Stable with medication Refilled Vyvanse 40 mg. We will continue to monitor.       No orders of the defined types were placed  in this encounter.    Follow-up: No follow-ups on file.  Kara Dies, NP

## 2021-09-27 ENCOUNTER — Encounter: Payer: Self-pay | Admitting: Nurse Practitioner

## 2021-09-27 MED ORDER — VYVANSE 40 MG PO CAPS: ORAL_CAPSULE | ORAL | 0 refills | Status: DC

## 2021-09-27 NOTE — Assessment & Plan Note (Signed)
Stable with medication Refilled Vyvanse 40 mg. We will continue to monitor.

## 2021-10-03 ENCOUNTER — Other Ambulatory Visit: Payer: Self-pay

## 2021-10-31 ENCOUNTER — Other Ambulatory Visit: Payer: Self-pay | Admitting: *Deleted

## 2021-10-31 DIAGNOSIS — F900 Attention-deficit hyperactivity disorder, predominantly inattentive type: Secondary | ICD-10-CM

## 2021-11-01 MED ORDER — VYVANSE 40 MG PO CAPS: ORAL_CAPSULE | ORAL | 0 refills | Status: DC

## 2021-11-01 NOTE — Telephone Encounter (Signed)
Refilled Vyvanse 40 mg for 30 days.

## 2021-11-30 ENCOUNTER — Other Ambulatory Visit: Payer: Self-pay | Admitting: *Deleted

## 2021-11-30 DIAGNOSIS — F900 Attention-deficit hyperactivity disorder, predominantly inattentive type: Secondary | ICD-10-CM

## 2021-11-30 MED ORDER — VYVANSE 40 MG PO CAPS: ORAL_CAPSULE | ORAL | 0 refills | Status: DC

## 2021-12-30 ENCOUNTER — Ambulatory Visit (INDEPENDENT_AMBULATORY_CARE_PROVIDER_SITE_OTHER): Payer: BC Managed Care – PPO | Admitting: Nurse Practitioner

## 2021-12-30 ENCOUNTER — Encounter: Payer: Self-pay | Admitting: Nurse Practitioner

## 2021-12-30 VITALS — BP 121/80 | HR 90 | Ht 74.0 in | Wt 214.8 lb

## 2021-12-30 DIAGNOSIS — F900 Attention-deficit hyperactivity disorder, predominantly inattentive type: Secondary | ICD-10-CM | POA: Diagnosis not present

## 2021-12-30 MED ORDER — VYVANSE 40 MG PO CAPS: ORAL_CAPSULE | ORAL | 0 refills | Status: DC

## 2021-12-30 NOTE — Progress Notes (Unsigned)
Established Patient Office Visit  Subjective:  Patient ID: Mitchell Fry, male    DOB: May 23, 1995  Age: 26 y.o. MRN: [id number]  CC:  No chief complaint on file.   HPI  Mitchell Fry presents for routine follow up and medication refill.  He has ADHD and on  Vyvanse 40 mg. He takes melatonin 5 mg as needed for sleep. He is overall doing good and no concerns at present.   Past Medical History:  Diagnosis Date   ADHD     Past Surgical History:  Procedure Laterality Date   arm surgery     Had to be sedated to have arm reset    Family History  Problem Relation Age of Onset   Depression Mother    Depression Father    Mental illness Maternal Grandmother    Mental illness Maternal Grandfather    Mental illness Paternal Grandmother    Mental illness Paternal Grandfather     Social History   Socioeconomic History   Marital status: Single    Spouse name: Not on file   Number of children: Not on file   Years of education: Not on file   Highest education level: Not on file  Occupational History   Occupation: Furniture conservator/restorer  Tobacco Use   Smoking status: Former    Packs/day: 1.00    Years: 4.00    Total pack years: 4.00    Types: Cigarettes    Quit date: 08/25/2016    Years since quitting: 5.3   Smokeless tobacco: Never  Vaping Use   Vaping Use: Never used  Substance and Sexual Activity   Alcohol use: No    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   Drug use: No   Sexual activity: Yes    Partners: Female  Other Topics Concern   Not on file  Social History Narrative   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    No Known Allergies  ROS Review of Systems  Constitutional:  Negative for activity change and fatigue.  HENT:  Negative for congestion, hearing loss and tinnitus.   Eyes:  Negative for discharge and redness.  Respiratory:  Negative for apnea, cough and wheezing.   Cardiovascular:  Negative for chest pain and palpitations.  Gastrointestinal:  Negative for abdominal distention, blood in stool and nausea.  Genitourinary:  Negative for difficulty urinating, flank pain and hematuria.  Musculoskeletal:  Negative for arthralgias, gait problem and neck pain.  Skin:  Negative for color change, pallor and rash.  Neurological:  Negative for dizziness, facial asymmetry and headaches.  Psychiatric/Behavioral:  Negative for agitation, behavioral problems, confusion and decreased concentration.       Objective:    Physical Exam Constitutional:      Appearance: He is overweight.  HENT:     Head: Normocephalic and atraumatic.     Right Ear: Tympanic membrane normal.     Left Ear: Tympanic membrane normal.  Nose: Nose normal.     Mouth/Throat:     Mouth: Mucous membranes are moist.     Pharynx: Oropharynx is clear.  Eyes:     Conjunctiva/sclera: Conjunctivae normal.     Pupils: Pupils are equal, round, and reactive to light.  Cardiovascular:     Rate and Rhythm: Normal rate and regular rhythm.     Pulses: Normal pulses.     Heart sounds: Normal heart sounds.  Pulmonary:     Effort: Pulmonary effort is normal.     Breath sounds: Normal breath sounds.  Abdominal:     General: Abdomen is flat. Bowel sounds are normal.     Palpations: Abdomen is soft.  Musculoskeletal:        General: Normal range of motion.     Cervical back: Normal range of motion.     Comments: Left index finger amputation.  Skin:    General: Skin is warm.     Capillary Refill: Capillary  refill takes less than 2 seconds.  Neurological:     General: No focal deficit present.     Mental Status: He is alert and oriented to person, place, and time. Mental status is at baseline.  Psychiatric:        Mood and Affect: Mood normal.        Behavior: Behavior normal.        Thought Content: Thought content normal.     There were no vitals taken for this visit. Wt Readings from Last 3 Encounters:  09/23/21 214 lb 8 oz (97.3 kg)  08/12/21 213 lb (96.6 kg)  07/15/21 213 lb (96.6 kg)     Health Maintenance Due  Topic Date Due   COVID-19 Vaccine (1) Never done   Hepatitis C Screening  Never done   INFLUENZA VACCINE  10/04/2021    There are no preventive care reminders to display for this patient.  No results found for: "TSH" Lab Results  Component Value Date   WBC 4.2 04/30/2018   HGB 15.9 04/30/2018   HCT 46.7 04/30/2018   MCV 92.3 04/30/2018   PLT 287.0 04/30/2018   Lab Results  Component Value Date   NA 138 06/03/2021   K 4.7 06/03/2021   CO2 26 06/03/2021   GLUCOSE 95 06/03/2021   BUN 10 06/03/2021   CREATININE 0.87 06/03/2021   BILITOT 0.3 06/03/2021   ALKPHOS 84 04/30/2018   AST 24 06/03/2021   ALT 26 06/03/2021   PROT 7.5 06/03/2021   ALBUMIN 4.7 04/30/2018   CALCIUM 9.7 06/03/2021   EGFR 123 06/03/2021   GFR 127.94 04/30/2018   Lab Results  Component Value Date   CHOL 167 06/03/2021   Lab Results  Component Value Date   HDL 38 (L) 06/03/2021   Lab Results  Component Value Date   LDLCALC 102 (H) 06/03/2021   Lab Results  Component Value Date   TRIG 173 (H) 06/03/2021   Lab Results  Component Value Date   CHOLHDL 4.4 06/03/2021   No results found for: "HGBA1C"    Assessment & Plan:   Problem List Items Addressed This Visit   None  No orders of the defined types were placed in this encounter.    Follow-up: No follow-ups on file.    Theresia Lo, NP

## 2022-01-02 NOTE — Assessment & Plan Note (Signed)
Stable on medication. Vyvanse 40 mg refilled.

## 2022-01-25 ENCOUNTER — Other Ambulatory Visit: Payer: Self-pay | Admitting: Nurse Practitioner

## 2022-01-25 DIAGNOSIS — F900 Attention-deficit hyperactivity disorder, predominantly inattentive type: Secondary | ICD-10-CM

## 2022-01-25 MED ORDER — VYVANSE 40 MG PO CAPS: ORAL_CAPSULE | ORAL | 0 refills | Status: DC

## 2022-02-22 ENCOUNTER — Other Ambulatory Visit: Payer: Self-pay | Admitting: Nurse Practitioner

## 2022-02-22 DIAGNOSIS — F900 Attention-deficit hyperactivity disorder, predominantly inattentive type: Secondary | ICD-10-CM

## 2022-02-23 ENCOUNTER — Other Ambulatory Visit: Payer: Self-pay | Admitting: Nurse Practitioner

## 2022-02-23 DIAGNOSIS — F900 Attention-deficit hyperactivity disorder, predominantly inattentive type: Secondary | ICD-10-CM

## 2022-02-23 MED ORDER — VYVANSE 40 MG PO CAPS: ORAL_CAPSULE | ORAL | 0 refills | Status: DC

## 2022-03-17 ENCOUNTER — Encounter: Payer: Self-pay | Admitting: Nurse Practitioner

## 2022-03-17 ENCOUNTER — Ambulatory Visit: Payer: BC Managed Care – PPO | Admitting: Nurse Practitioner

## 2022-03-17 VITALS — BP 122/78 | HR 87 | Temp 98.3°F | Ht 74.0 in | Wt 216.0 lb

## 2022-03-17 DIAGNOSIS — F900 Attention-deficit hyperactivity disorder, predominantly inattentive type: Secondary | ICD-10-CM | POA: Diagnosis not present

## 2022-03-17 DIAGNOSIS — G4700 Insomnia, unspecified: Secondary | ICD-10-CM | POA: Insufficient documentation

## 2022-03-17 DIAGNOSIS — G479 Sleep disorder, unspecified: Secondary | ICD-10-CM | POA: Insufficient documentation

## 2022-03-17 DIAGNOSIS — E663 Overweight: Secondary | ICD-10-CM | POA: Diagnosis not present

## 2022-03-17 MED ORDER — VYVANSE 40 MG PO CAPS: ORAL_CAPSULE | ORAL | 0 refills | Status: DC

## 2022-03-17 NOTE — Assessment & Plan Note (Signed)
He has gained 2 pounds since last visit. Encouraged him to follow regular exercise regimen, and 150 minutes of physical activity a week

## 2022-03-17 NOTE — Progress Notes (Signed)
Established Patient Office Visit  Subjective:  Patient ID: Mitchell Fry, male    DOB: 05/29/1995  Age: 27 y.o. MRN: [id number]  CC:  Chief Complaint  Patient presents with   ADHD    HPI  Mitchell Fry presents for routine follow up on ADHD and  medication refill. He takes melatonin 5 mg from Monday to Wednesday. He is overall doing Well and no new concerns at present.   Past Medical History:  Diagnosis Date   ADHD     Past Surgical History:  Procedure Laterality Date   arm surgery     Had to be sedated to have arm reset    Family History  Problem Relation Age of Onset   Depression Mother    Depression Father    Mental illness Maternal Grandmother    Mental illness Maternal Grandfather    Mental illness Paternal Grandmother    Mental illness Paternal Grandfather     Social History   Socioeconomic History   Marital status: Single    Spouse name: Not on file   Number of children: Not on file   Years of education: Not on file   Highest education level: Not on file  Occupational History   Occupation: Furniture conservator/restorer  Tobacco Use   Smoking status: Former    Packs/day: 1.00    Years: 4.00    Total pack years: 4.00    Types: Cigarettes    Quit date: 08/25/2016    Years since quitting: 5.5   Smokeless tobacco: Never  Vaping Use   Vaping Use: Never used  Substance and Sexual Activity   Alcohol use: No    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     VYVANSE 40 MG capsule TAKE 1 CAPSULE BY MOUTH DAIL 30 capsule 0   No facility-administered medications prior to visit.    No Known Allergies  ROS Review of Systems  Constitutional:  Negative for activity change and fatigue.  HENT:  Negative for congestion, hearing loss and tinnitus.   Eyes:  Negative for discharge and redness.  Respiratory:  Negative for apnea, cough and wheezing.   Cardiovascular:  Negative for chest pain and palpitations.  Gastrointestinal:  Negative for abdominal distention, blood in stool and nausea.  Genitourinary:  Negative for difficulty urinating, flank pain and hematuria.  Musculoskeletal:  Negative for arthralgias, gait problem and neck pain.  Skin:  Negative for color change, pallor and rash.  Neurological:  Negative for dizziness, facial asymmetry and headaches.  Psychiatric/Behavioral:  Negative for agitation, behavioral problems, confusion and decreased concentration.       Objective:    Physical Exam Constitutional:      Appearance: He is overweight.  HENT:     Head: Normocephalic and atraumatic.     Right Ear: Tympanic membrane normal.  Left Ear: Tympanic membrane normal.     Nose: Nose normal.     Mouth/Throat:     Mouth: Mucous membranes are moist.     Pharynx: Oropharynx is clear.  Eyes:     Conjunctiva/sclera: Conjunctivae normal.     Pupils: Pupils are equal, round, and reactive to light.  Cardiovascular:     Rate and Rhythm: Normal rate and regular rhythm.     Pulses: Normal pulses.     Heart sounds: Normal heart sounds.  Pulmonary:     Effort: Pulmonary effort is normal.     Breath sounds: Normal breath sounds.  Abdominal:     General: Abdomen is flat. Bowel sounds are normal.     Palpations: Abdomen is soft.  Musculoskeletal:        General: Normal range of motion.     Cervical back: Normal range of motion.     Comments: Left index finger amputation.  Skin:     General: Skin is warm.     Capillary Refill: Capillary refill takes less than 2 seconds.  Neurological:     General: No focal deficit present.     Mental Status: He is alert and oriented to person, place, and time. Mental status is at baseline.  Psychiatric:        Mood and Affect: Mood normal.        Behavior: Behavior normal.        Thought Content: Thought content normal.     BP 122/78   Pulse 87   Temp 98.3 F (36.8 C)   Ht 6\' 2"  (1.88 m)   Wt 216 lb (98 kg)   SpO2 99%   BMI 27.73 kg/m  Wt Readings from Last 3 Encounters:  03/17/22 216 lb (98 kg)  12/30/21 214 lb 12.8 oz (97.4 kg)  09/23/21 214 lb 8 oz (97.3 kg)     Health Maintenance Due  Topic Date Due   COVID-19 Vaccine (1) Never done   Hepatitis C Screening  Never done    There are no preventive care reminders to display for this patient.  No results found for: "TSH" Lab Results  Component Value Date   WBC 4.2 04/30/2018   HGB 15.9 04/30/2018   HCT 46.7 04/30/2018   MCV 92.3 04/30/2018   PLT 287.0 04/30/2018   Lab Results  Component Value Date   NA 138 06/03/2021   K 4.7 06/03/2021   CO2 26 06/03/2021   GLUCOSE 95 06/03/2021   BUN 10 06/03/2021   CREATININE 0.87 06/03/2021   BILITOT 0.3 06/03/2021   ALKPHOS 84 04/30/2018   AST 24 06/03/2021   ALT 26 06/03/2021   PROT 7.5 06/03/2021   ALBUMIN 4.7 04/30/2018   CALCIUM 9.7 06/03/2021   EGFR 123 06/03/2021   GFR 127.94 04/30/2018   Lab Results  Component Value Date   CHOL 167 06/03/2021   Lab Results  Component Value Date   HDL 38 (L) 06/03/2021   Lab Results  Component Value Date   LDLCALC 102 (H) 06/03/2021   Lab Results  Component Value Date   TRIG 173 (H) 06/03/2021   Lab Results  Component Value Date   CHOLHDL 4.4 06/03/2021   No results found for: "HGBA1C"    Assessment & Plan:   Problem List Items Addressed This Visit       Other   ADHD (attention deficit hyperactivity disorder), inattentive type - Primary     Stable on medication. Continue Vyvanse 40 mg daily.  Relevant Medications   VYVANSE 40 MG capsule   Overweight (BMI 25.0-29.9)    He has gained 2 pounds since last visit. Encouraged him to follow regular exercise regimen, and 150 minutes of physical activity a week      Sleep disorder    Stable on melatonin 5 mg. Encourage patient to promote regular sleep schedules and sleep hygiene.      Meds ordered this encounter  Medications   VYVANSE 40 MG capsule    Sig: TAKE 1 CAPSULE BY MOUTH DAIL    Dispense:  30 capsule    Refill:  0     Follow-up: No follow-ups on file.    Theresia Lo, NP

## 2022-03-17 NOTE — Assessment & Plan Note (Signed)
Stable on melatonin 5 mg. Encourage patient to promote regular sleep schedules and sleep hygiene.

## 2022-03-17 NOTE — Assessment & Plan Note (Signed)
Stable on medication. Continue Vyvanse 40 mg daily.

## 2022-04-14 ENCOUNTER — Ambulatory Visit: Payer: BC Managed Care – PPO | Admitting: Nurse Practitioner

## 2022-04-15 DIAGNOSIS — J0100 Acute maxillary sinusitis, unspecified: Secondary | ICD-10-CM | POA: Diagnosis not present

## 2022-04-24 ENCOUNTER — Other Ambulatory Visit: Payer: Self-pay | Admitting: Nurse Practitioner

## 2022-04-24 DIAGNOSIS — F900 Attention-deficit hyperactivity disorder, predominantly inattentive type: Secondary | ICD-10-CM

## 2022-04-25 ENCOUNTER — Telehealth: Payer: Self-pay

## 2022-04-25 MED ORDER — VYVANSE 40 MG PO CAPS: ORAL_CAPSULE | ORAL | 0 refills | Status: DC

## 2022-04-25 NOTE — Telephone Encounter (Signed)
Lm for pt to cb  Rx controlled, sent to [facility] for signature. Charan not in office until Thursday Does pt have medication until then ?

## 2022-04-25 NOTE — Telephone Encounter (Signed)
Prescription Request  04/25/2022  Is this a "Controlled Substance" medicine? Yes  LOV: Visit date not found  What is the name of the medication or equipment?  VYVANSE 40 MG capsule   Have you contacted your pharmacy to request a refill? Yes   Which pharmacy would you like this sent to?  [id number] [id number]: [phone]: [phone]   Patient notified that their request is being sent to the clinical staff for review and that they should receive a response within 2 business days.   Please advise at Mobile [phone](mobile)   Patient states he will be out of this medication in two days.

## 2022-05-22 ENCOUNTER — Other Ambulatory Visit: Payer: Self-pay | Admitting: Nurse Practitioner

## 2022-05-22 DIAGNOSIS — F900 Attention-deficit hyperactivity disorder, predominantly inattentive type: Secondary | ICD-10-CM

## 2022-05-22 NOTE — Telephone Encounter (Signed)
Last OV: 03/17/2022   Next OV: 06/16/22

## 2022-05-23 MED ORDER — VYVANSE 40 MG PO CAPS: ORAL_CAPSULE | ORAL | 0 refills | Status: DC

## 2022-06-16 ENCOUNTER — Ambulatory Visit: Payer: BC Managed Care – PPO | Admitting: Nurse Practitioner

## 2022-06-16 ENCOUNTER — Encounter: Payer: Self-pay | Admitting: Nurse Practitioner

## 2022-06-16 VITALS — BP 118/70 | HR 75 | Temp 98.3°F | Ht 74.0 in | Wt 211.2 lb

## 2022-06-16 DIAGNOSIS — Z0000 Encounter for general adult medical examination without abnormal findings: Secondary | ICD-10-CM | POA: Diagnosis not present

## 2022-06-16 DIAGNOSIS — F900 Attention-deficit hyperactivity disorder, predominantly inattentive type: Secondary | ICD-10-CM | POA: Diagnosis not present

## 2022-06-16 LAB — CBC WITH DIFFERENTIAL/PLATELET
Basophils Absolute: 0 10*3/uL (ref 0.0–0.1)
Basophils Relative: 0.5 % (ref 0.0–3.0)
Eosinophils Absolute: 0.3 10*3/uL (ref 0.0–0.7)
Eosinophils Relative: 5.1 % — ABNORMAL HIGH (ref 0.0–5.0)
HCT: 45.5 % (ref 39.0–52.0)
Hemoglobin: 15.8 g/dL (ref 13.0–17.0)
Lymphocytes Relative: 34.8 % (ref 12.0–46.0)
Lymphs Abs: 1.8 10*3/uL (ref 0.7–4.0)
MCHC: 34.8 g/dL (ref 30.0–36.0)
MCV: 91.6 fl (ref 78.0–100.0)
Monocytes Absolute: 0.5 10*3/uL (ref 0.1–1.0)
Monocytes Relative: 10.5 % (ref 3.0–12.0)
Neutro Abs: 2.5 10*3/uL (ref 1.4–7.7)
Neutrophils Relative %: 49.1 % (ref 43.0–77.0)
Platelets: 232 10*3/uL (ref 150.0–400.0)
RBC: 4.97 Mil/uL (ref 4.22–5.81)
RDW: 12.3 % (ref 11.5–15.5)
WBC: 5.2 10*3/uL (ref 4.0–10.5)

## 2022-06-16 LAB — COMPREHENSIVE METABOLIC PANEL
ALT: 34 U/L (ref 0–53)
AST: 23 U/L (ref 0–37)
Albumin: 4.7 g/dL (ref 3.5–5.2)
Alkaline Phosphatase: 85 U/L (ref 39–117)
BUN: 14 mg/dL (ref 6–23)
CO2: 27 mEq/L (ref 19–32)
Calcium: 9.7 mg/dL (ref 8.4–10.5)
Chloride: 104 mEq/L (ref 96–112)
Creatinine, Ser: 0.84 mg/dL (ref 0.40–1.50)
GFR: 120.45 mL/min (ref >60.00)
Glucose, Bld: 85 mg/dL (ref 70–99)
Potassium: 4.3 mEq/L (ref 3.5–5.1)
Sodium: 138 mEq/L (ref 135–145)
Total Bilirubin: 0.4 mg/dL (ref 0.2–1.2)
Total Protein: 7.1 g/dL (ref 6.0–8.3)

## 2022-06-16 LAB — LIPID PANEL
Cholesterol: 162 mg/dL (ref 0–200)
HDL: 35.5 mg/dL — ABNORMAL LOW (ref >39.00)
LDL Cholesterol: 91 mg/dL (ref 0–99)
NonHDL: 126.47
Total CHOL/HDL Ratio: 5
Triglycerides: 179 mg/dL — ABNORMAL HIGH (ref 0.0–149.0)
VLDL: 35.8 mg/dL (ref 0.0–40.0)

## 2022-06-16 LAB — TSH: TSH: 1.3 u[IU]/mL (ref 0.35–5.50)

## 2022-06-16 MED ORDER — VYVANSE 40 MG PO CAPS: ORAL_CAPSULE | ORAL | 0 refills | Status: DC

## 2022-06-16 NOTE — Progress Notes (Unsigned)
Established Patient Office Visit  Subjective:  Patient ID: Mitchell Fry, male    DOB: 02-Mar-1996  Age: 27 y.o. MRN: [id number]  CC:  Chief Complaint  Patient presents with   Annual Exam    HPI  Mitchell Fry presents for annual physical. He has history of ADHD and hyperlipidemia.   Flu: no Tetanus: 2022 COVID: 2021 [facility]ist:due Eye examination: due Exercise: walking and hiking once a week.  Smoking: No Drinking:No   Diet: Patient does eat meat. Patient consumes more fruits and veggies. Patient eat some  fried food. Patient mostly drink diet coke and some water.    Past Medical History:  Diagnosis Date   ADHD     Past Surgical History:  Procedure Laterality Date   arm surgery     Had to be sedated to have arm reset    Family History  Problem Relation Age of Onset   Depression Mother    Depression Father    Mental illness Maternal Grandmother    Mental illness Maternal Grandfather    Mental illness Paternal Grandmother    Mental illness Paternal Grandfather     Social History   Socioeconomic History   Marital status: Single    Spouse name: Not on file   Number of children: Not on file   Years of education: Not on file   Highest education level: Not on file  Occupational History   Occupation: Energy manager  Tobacco Use   Smoking status: Former    Packs/day: 1.00    Years: 4.00    Additional pack years: 0.00    Total pack years: 4.00    Types: Cigarettes    Quit date: 08/25/2016    Years since quitting: 5.8   Smokeless tobacco: Never  Vaping Use   Vaping Use: Never used  Substance and Sexual Activity   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   Drug use: No   Sexual activity: Yes    Partners: Female  Other Topics Concern   Not on file  Social History Narrative   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     No facility-administered medications prior to visit.    No Known Allergies  ROS Review of Systems  Constitutional:  Negative for activity change and fatigue.  HENT:  Negative for congestion, hearing loss and tinnitus.   Eyes:  Negative for discharge and redness.  Respiratory:  Negative for apnea, cough and wheezing.   Cardiovascular:  Negative for chest pain and palpitations.  Gastrointestinal:  Negative for abdominal distention, blood in stool and nausea.  Genitourinary:  Negative for difficulty urinating, flank pain and hematuria.  Musculoskeletal:  Negative for arthralgias, gait problem and neck pain.  Skin:  Negative for color change, pallor and rash.  Neurological:  Negative for dizziness, facial asymmetry and headaches.  Psychiatric/Behavioral:  Negative for agitation, behavioral problems, confusion and decreased concentration.  Objective:    Physical Exam Constitutional:      Appearance: He is overweight.  HENT:     Head: Normocephalic and atraumatic.     Right Ear: Tympanic membrane normal.     Left Ear: Tympanic membrane normal.     Nose: Nose normal.     Mouth/Throat:     Mouth: Mucous membranes are moist.     Pharynx: Oropharynx is clear.  Eyes:     Conjunctiva/sclera: Conjunctivae normal.     Pupils: Pupils are equal, round, and reactive to light.  Cardiovascular:     Rate and Rhythm: Normal rate and regular rhythm.     Pulses: Normal pulses.     Heart sounds: Normal heart sounds.  Pulmonary:     Effort: Pulmonary effort is normal.     Breath sounds: Normal breath sounds.  Abdominal:     General:  Abdomen is flat. Bowel sounds are normal.     Palpations: Abdomen is soft.  Musculoskeletal:        General: Normal range of motion.     Cervical back: Normal range of motion.     Comments: Left index finger amputation.  Skin:    General: Skin is warm.     Capillary Refill: Capillary refill takes less than 2 seconds.  Neurological:     General: No focal deficit present.     Mental Status: He is alert and oriented to person, place, and time. Mental status is at baseline.  Psychiatric:        Mood and Affect: Mood normal.        Behavior: Behavior normal.        Thought Content: Thought content normal.     BP 118/70   Pulse 75   Temp 98.3 F (36.8 C) (Oral)   Ht  (1.88 m)   Wt 211 lb 3.2 oz (95.8 kg)   SpO2 97%   BMI 27.12 kg/m  Wt Readings from Last 3 Encounters:  06/16/22 211 lb 3.2 oz (95.8 kg)  03/17/22 216 lb (98 kg)  12/30/21 214 lb 12.8 oz (97.4 kg)     Health Maintenance Due  Topic Date Due   Hepatitis C Screening  Never done    There are no preventive care reminders to display for this patient.  No results found for: "TSH" Lab Results  Component Value Date   WBC 4.2 04/30/2018   HGB 15.9 04/30/2018   HCT 46.7 04/30/2018   MCV 92.3 04/30/2018   PLT 287.0 04/30/2018   Lab Results  Component Value Date   NA 138 06/03/2021   K 4.7 06/03/2021   CO2 26 06/03/2021   GLUCOSE 95 06/03/2021   BUN 10 06/03/2021   CREATININE 0.87 06/03/2021   BILITOT 0.3 06/03/2021   ALKPHOS 84 04/30/2018   AST 24 06/03/2021   ALT 26 06/03/2021   PROT 7.5 06/03/2021   ALBUMIN 4.7 04/30/2018   CALCIUM 9.7 06/03/2021   EGFR 123 06/03/2021   GFR 127.94 04/30/2018   Lab Results  Component Value Date   CHOL 167 06/03/2021   Lab Results  Component Value Date   HDL 38 (L) 06/03/2021   Lab Results  Component Value Date   LDLCALC 102 (H) 06/03/2021   Lab Results  Component Value Date   TRIG 173 (H) 06/03/2021   Lab Results  Component Value Date   CHOLHDL  4.4 06/03/2021   No results found for: "HGBA1C"    Assessment & Plan:  Problem List Items Addressed This Visit       Other   Annual physical exam - Primary    Encouraged patient to consume a balanced diet and regular exercise regimen. Encouraged to increase water intake Advised to see an eye doctor and dentist annually.  Labs ordered.        Relevant Orders   CBC with Differential/Platelet   Comprehensive metabolic panel   Lipid panel   TSH   No orders of the defined types were placed in this encounter.    Follow-up: No follow-ups on file.    Kara Dies, NP

## 2022-06-16 NOTE — Assessment & Plan Note (Signed)
Encouraged patient to consume a balanced diet and regular exercise regimen. Encouraged to increase water intake Advised to see an eye doctor and dentist annually.  Labs ordered.

## 2022-06-16 NOTE — Patient Instructions (Signed)
Labs ordered. Routine care. Follow up 3 months.

## 2022-07-19 ENCOUNTER — Other Ambulatory Visit: Payer: Self-pay | Admitting: Nurse Practitioner

## 2022-07-19 DIAGNOSIS — F900 Attention-deficit hyperactivity disorder, predominantly inattentive type: Secondary | ICD-10-CM

## 2022-07-19 NOTE — Telephone Encounter (Signed)
Lov: 06/16/2022   Nov: 09/15/2022

## 2022-07-20 MED ORDER — VYVANSE 40 MG PO CAPS
ORAL_CAPSULE | ORAL | 0 refills | Status: DC
Start: 2022-07-20 — End: 2022-08-15

## 2022-07-25 DIAGNOSIS — L089 Local infection of the skin and subcutaneous tissue, unspecified: Secondary | ICD-10-CM | POA: Diagnosis not present

## 2022-08-15 ENCOUNTER — Other Ambulatory Visit: Payer: Self-pay | Admitting: Nurse Practitioner

## 2022-08-15 DIAGNOSIS — F900 Attention-deficit hyperactivity disorder, predominantly inattentive type: Secondary | ICD-10-CM

## 2022-08-17 MED ORDER — VYVANSE 40 MG PO CAPS: ORAL_CAPSULE | ORAL | 0 refills | Status: DC

## 2022-09-13 ENCOUNTER — Other Ambulatory Visit: Payer: Self-pay | Admitting: Nurse Practitioner

## 2022-09-13 DIAGNOSIS — F900 Attention-deficit hyperactivity disorder, predominantly inattentive type: Secondary | ICD-10-CM

## 2022-09-14 MED ORDER — VYVANSE 40 MG PO CAPS
ORAL_CAPSULE | ORAL | 0 refills | Status: DC
Start: 2022-09-14 — End: 2022-10-12

## 2022-09-15 ENCOUNTER — Encounter: Payer: Self-pay | Admitting: Nurse Practitioner

## 2022-09-15 ENCOUNTER — Ambulatory Visit: Payer: BC Managed Care – PPO | Admitting: Nurse Practitioner

## 2022-09-15 VITALS — BP 118/78 | HR 86 | Temp 98.6°F | Ht 74.0 in | Wt 208.0 lb

## 2022-09-15 DIAGNOSIS — F902 Attention-deficit hyperactivity disorder, combined type: Secondary | ICD-10-CM

## 2022-09-15 NOTE — Progress Notes (Signed)
Established Patient Office Visit  Subjective:  Patient ID: Mitchell Fry, male    DOB: 1995/10/25  Age: 27 y.o. MRN: [id number]  CC:  Chief Complaint  Patient presents with   Medical Management of Chronic Issues    3 month follow up    HPI0  Mitchell Fry presents for 3 months follow up. He is taking vynase daily. He sometimes use OTC allergy medication. He has recently changed the job and  trying to eat more healthy.   Denies any concern at present.  HPI   Past Medical History:  Diagnosis Date   ADHD     Past Surgical History:  Procedure Laterality Date   arm surgery     Had to be sedated to have arm reset    Family History  Problem Relation Age of Onset   Depression Mother    Depression Father    Mental illness Maternal Grandmother    Mental illness Maternal Grandfather    Mental illness Paternal Grandmother    Mental illness Paternal Grandfather     Social History   Socioeconomic History   Marital status: Single    Spouse name: Not on file   Number of children: Not on file   Years of education: Not on file   Highest education level: Not on file  Occupational History   Occupation: Energy manager  Tobacco Use   Smoking status: Former    Current packs/day: 0.00    Average packs/day: 1 pack/day for 4.0 years (4.0 ttl pk-yrs)    Types: Cigarettes    Start date: 08/25/2012    Quit date: 08/25/2016    Years since quitting: 6.1   Smokeless tobacco: Never  Vaping Use   Vaping status: Never Used  Substance and Sexual Activity   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   Drug use: No   Sexual activity: Yes    Partners: Female  Other Topics Concern   Not on file  Social History Narrative   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   Social Determinants of Corporate investment banker Strain: Not on file  Food Insecurity: Not on file  Transportation Needs: Not on file  Physical Activity: Not on file  Stress: Not on file   Social Connections: Unknown (12/28/2020)   Received from [facility], [facility]   Social Connections    Frequency of Communication with Friends and Family: Not asked    Frequency of Social Gatherings with Friends and Family: Not asked  Intimate Partner Violence: Unknown (12/28/2020)   Received from [facility], [facility]   Intimate Partner Violence    Fear of Current or Ex-Partner: Not asked    Emotionally Abused: Not asked    Physically Abused: Not asked    Sexually Abused: Not asked     Outpatient Medications Prior to Visit  Medication Sig Dispense Refill   VYVANSE 40 MG capsule TAKE 1 CAPSULE BY MOUTH DAIL 30 capsule 0   No facility-administered medications prior to visit.    No Known Allergies  ROS Review of Systems Negative unless indicated in HPI.    Objective:    Physical Exam Constitutional:      Appearance: Normal appearance.  HENT:     Head: Normocephalic.     Right Ear: Tympanic membrane normal.     Left Ear: Tympanic membrane normal.     Mouth/Throat:     Mouth: Mucous membranes are moist.  Eyes:     Conjunctiva/sclera: Conjunctivae normal.     Pupils: Pupils are equal, round, and reactive to light.  Cardiovascular:     Rate and Rhythm: Normal rate and regular rhythm.     Pulses: Normal pulses.     Heart sounds: Normal heart sounds.  Pulmonary:     Effort: Pulmonary effort is normal.     Breath sounds: Normal breath sounds.  Abdominal:     General: Bowel sounds are normal.     Palpations: Abdomen is soft.  Musculoskeletal:     Cervical back: Normal range of motion. No tenderness.  Skin:    General: Skin is warm.     Findings: No bruising.  Neurological:     General: No focal deficit present.     Mental Status: He is alert and oriented to person, place, and time. Mental status is at baseline.  Psychiatric:        Mood and Affect: Mood normal.        Behavior: Behavior normal.        Thought Content: Thought content normal.         Judgment: Judgment normal.     BP 118/78 (BP Location: Left Arm, Patient Position: Sitting, Cuff Size: Large)   Pulse 86   Temp 98.6 F (37 C) (Oral)   Ht 6\' 2"  (1.88 m)   Wt 208 lb (94.3 kg)   SpO2 98%   BMI 26.71 kg/m  Wt Readings from Last 3 Encounters:  09/15/22 208 lb (94.3 kg)  06/16/22 211 lb 3.2 oz (95.8 kg)  03/17/22 216 lb (98 kg)     Health Maintenance  Topic Date Due   Hepatitis C Screening  Never done   COVID-19 Vaccine (1 - 2023-24 season) Never done   INFLUENZA VACCINE  10/05/2022   DTaP/Tdap/Td (8 - Td or Tdap) 12/29/2030   HPV VACCINES  Completed   HIV Screening  Completed    There are no preventive care reminders to display for this patient.  Lab Results  Component Value Date   TSH 1.30 06/16/2022   Lab Results  Component Value Date   WBC 5.2 06/16/2022   HGB 15.8 06/16/2022   HCT 45.5 06/16/2022   MCV 91.6 06/16/2022   PLT 232.0 06/16/2022   Lab Results  Component Value Date   NA 138 06/16/2022   K 4.3 06/16/2022   CO2 27 06/16/2022   GLUCOSE 85 06/16/2022   BUN 14 06/16/2022   CREATININE 0.84 06/16/2022   BILITOT 0.4 06/16/2022   ALKPHOS 85 06/16/2022   AST 23 06/16/2022   ALT 34 06/16/2022   PROT 7.1 06/16/2022   ALBUMIN 4.7 06/16/2022   CALCIUM 9.7 06/16/2022   EGFR 123 06/03/2021   GFR 120.45 06/16/2022   Lab Results  Component Value Date   CHOL 162 06/16/2022   Lab Results  Component Value Date   HDL 35.50 (L) 06/16/2022   Lab Results  Component Value Date   LDLCALC 91 06/16/2022   Lab Results  Component Value Date   TRIG 179.0 (H) 06/16/2022   Lab Results  Component Value Date   CHOLHDL 5 06/16/2022   No results found for: "HGBA1C"    Assessment & Plan:  Attention deficit hyperactivity disorder (ADHD), combined type Assessment & Plan: Stable on medication Continue Vyvanse 40 mg daily     Follow-up: Return in about 6 months (around 03/18/2023) for chronic management.   Kara Dies, NP

## 2022-09-15 NOTE — Patient Instructions (Signed)
Routine care.

## 2022-10-01 NOTE — Assessment & Plan Note (Signed)
Stable on medication. Continue Vyvanse 40 mg daily.

## 2022-10-12 ENCOUNTER — Other Ambulatory Visit: Payer: Self-pay | Admitting: Nurse Practitioner

## 2022-10-12 DIAGNOSIS — F900 Attention-deficit hyperactivity disorder, predominantly inattentive type: Secondary | ICD-10-CM

## 2022-10-12 MED ORDER — VYVANSE 40 MG PO CAPS: ORAL_CAPSULE | ORAL | 0 refills | Status: DC

## 2022-10-12 NOTE — Telephone Encounter (Signed)
LOV 09/15/2022 NOV No follow up

## 2022-11-11 ENCOUNTER — Other Ambulatory Visit: Payer: Self-pay | Admitting: Nurse Practitioner

## 2022-11-11 DIAGNOSIS — F900 Attention-deficit hyperactivity disorder, predominantly inattentive type: Secondary | ICD-10-CM

## 2022-11-13 NOTE — Telephone Encounter (Signed)
VYVANSE 40 MG capsule  LR 10/12/2022 #30 0 refill LOV 09/15/2022 NOV NO follow up on file

## 2022-11-14 MED ORDER — VYVANSE 40 MG PO CAPS: ORAL_CAPSULE | ORAL | 0 refills | Status: DC

## 2022-12-11 ENCOUNTER — Other Ambulatory Visit: Payer: Self-pay | Admitting: Nurse Practitioner

## 2022-12-11 DIAGNOSIS — F900 Attention-deficit hyperactivity disorder, predominantly inattentive type: Secondary | ICD-10-CM

## 2022-12-12 MED ORDER — VYVANSE 40 MG PO CAPS: ORAL_CAPSULE | ORAL | 0 refills | Status: DC

## 2022-12-15 ENCOUNTER — Other Ambulatory Visit: Payer: Self-pay | Admitting: Nurse Practitioner

## 2022-12-15 DIAGNOSIS — F900 Attention-deficit hyperactivity disorder, predominantly inattentive type: Secondary | ICD-10-CM

## 2022-12-17 MED ORDER — VYVANSE 40 MG PO CAPS: ORAL_CAPSULE | ORAL | 0 refills | Status: DC

## 2023-01-10 ENCOUNTER — Other Ambulatory Visit: Payer: Self-pay | Admitting: Nurse Practitioner

## 2023-01-10 DIAGNOSIS — F900 Attention-deficit hyperactivity disorder, predominantly inattentive type: Secondary | ICD-10-CM

## 2023-01-10 MED ORDER — VYVANSE 40 MG PO CAPS: ORAL_CAPSULE | ORAL | 0 refills | Status: DC

## 2023-02-16 ENCOUNTER — Encounter: Payer: Self-pay | Admitting: Nurse Practitioner

## 2023-02-16 ENCOUNTER — Ambulatory Visit: Payer: BC Managed Care – PPO | Admitting: Nurse Practitioner

## 2023-02-16 VITALS — BP 120/70 | HR 84 | Temp 98.1°F | Ht 74.0 in | Wt 208.8 lb

## 2023-02-16 DIAGNOSIS — Z79899 Other long term (current) drug therapy: Secondary | ICD-10-CM | POA: Insufficient documentation

## 2023-02-16 DIAGNOSIS — F900 Attention-deficit hyperactivity disorder, predominantly inattentive type: Secondary | ICD-10-CM | POA: Diagnosis not present

## 2023-02-16 DIAGNOSIS — Z0000 Encounter for general adult medical examination without abnormal findings: Secondary | ICD-10-CM

## 2023-02-16 NOTE — Progress Notes (Signed)
Established Patient Office Visit  Subjective:  Patient ID: Mitchell Fry, male    DOB: 28-Jan-1996  Age: 27 y.o. MRN: [id number]  CC:  Chief Complaint  Patient presents with   Medical Management of Chronic Issues    Med refill     HPI  Mitchell Fry presents for ADHD follow-up.  Patient reports there symptoms of ADHD have been well controlled with current medication. He reports staying on schedule and maintaining concentration.  Denies significant side effects such as insomnia, appetite change and mood swings.  Overall doing well primary complaints at present.  HPI   Past Medical History:  Diagnosis Date   ADHD     Past Surgical History:  Procedure Laterality Date   arm surgery     Had to be sedated to have arm reset    Family History  Problem Relation Age of Onset   Depression Mother    Depression Father    Mental illness Maternal Grandmother    Mental illness Maternal Grandfather    Mental illness Paternal Grandmother    Mental illness Paternal Grandfather     Social History   Socioeconomic History   Marital status: Single    Spouse name: Not on file   Number of children: Not on file   Years of education: Not on file   Highest education level: Not on file  Occupational History   Occupation: Energy manager  Tobacco Use   Smoking status: Former    Current packs/day: 0.00    Average packs/day: 1 pack/day for 4.0 years (4.0 ttl pk-yrs)    Types: Cigarettes    Start date: 08/25/2012    Quit date: 08/25/2016    Years since quitting: 6.5   Smokeless tobacco: Never  Vaping Use   Vaping status: Never Used  Substance and Sexual Activity   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   Drug use: No   Sexual activity: Yes    Partners: Female  Other Topics Concern   Not on file  Social History Narrative   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   Social Drivers of Corporate investment banker Strain: Not on file  Food Insecurity:  Not on file  Transportation Needs: Not on file  Physical Activity: Not on file  Stress: Not on file  Social Connections: Unknown (12/28/2020)   Received from [facility], [facility]   Social Connections    Frequency of Communication with Friends and Family: Not asked    Frequency of Social Gatherings with Friends and Family: Not asked  Intimate Partner Violence: Unknown (12/28/2020)   Received from [facility], [facility]   Intimate Partner Violence    Fear of Current or Ex-Partner: Not asked    Emotionally Abused: Not asked    Physically Abused: Not asked    Sexually Abused: Not asked     Outpatient Medications Prior to Visit  Medication Sig Dispense Refill   VYVANSE 40 MG capsule TAKE 1 CAPSULE BY MOUTH DAIL 30 capsule 0   No facility-administered medications prior to visit.    No Known Allergies  ROS Review of Systems Negative unless indicated in HPI.    Objective:    Physical Exam Constitutional:      Appearance: Normal appearance.  HENT:     Mouth/Throat:     Mouth: Mucous membranes are moist.  Eyes:     Conjunctiva/sclera: Conjunctivae normal.     Pupils: Pupils are  equal, round, and reactive to light.  Cardiovascular:     Rate and Rhythm: Normal rate and regular rhythm.     Pulses: Normal pulses.     Heart sounds: Normal heart sounds.  Pulmonary:     Effort: Pulmonary effort is normal.     Breath sounds: Normal breath sounds.  Abdominal:     General: Bowel sounds are normal.     Palpations: Abdomen is soft.  Musculoskeletal:     Cervical back: Normal range of motion. No tenderness.  Skin:    General: Skin is warm.     Findings: No bruising.  Neurological:     General: No focal deficit present.     Mental Status: He is alert and oriented to person, place, and time. Mental status is at baseline.  Psychiatric:        Mood and Affect: Mood normal.        Behavior: Behavior normal.        Thought Content: Thought content normal.         Judgment: Judgment normal.     BP 120/70   Pulse 84   Temp 98.1 F (36.7 C) (Oral)   Ht 6\' 2"  (1.88 m)   Wt 208 lb 12.8 oz (94.7 kg)   SpO2 99%   BMI 26.81 kg/m  Wt Readings from Last 3 Encounters:  02/16/23 208 lb 12.8 oz (94.7 kg)  09/15/22 208 lb (94.3 kg)  06/16/22 211 lb 3.2 oz (95.8 kg)     Health Maintenance  Topic Date Due   COVID-19 Vaccine (1) Never done   HIV Screening  Never done   Hepatitis C Screening  Never done   INFLUENZA VACCINE  06/04/2023 (Originally 10/05/2022)   DTaP/Tdap/Td (8 - Td or Tdap) 12/29/2030   HPV VACCINES  Completed    There are no preventive care reminders to display for this patient.  Lab Results  Component Value Date   TSH 1.30 06/16/2022   Lab Results  Component Value Date   WBC 5.2 06/16/2022   HGB 15.8 06/16/2022   HCT 45.5 06/16/2022   MCV 91.6 06/16/2022   PLT 232.0 06/16/2022   Lab Results  Component Value Date   NA 138 06/16/2022   K 4.3 06/16/2022   CO2 27 06/16/2022   GLUCOSE 85 06/16/2022   BUN 14 06/16/2022   CREATININE 0.84 06/16/2022   BILITOT 0.4 06/16/2022   ALKPHOS 85 06/16/2022   AST 23 06/16/2022   ALT 34 06/16/2022   PROT 7.1 06/16/2022   ALBUMIN 4.7 06/16/2022   CALCIUM 9.7 06/16/2022   EGFR 123 06/03/2021   GFR 120.45 06/16/2022   Lab Results  Component Value Date   CHOL 162 06/16/2022   Lab Results  Component Value Date   HDL 35.50 (L) 06/16/2022   Lab Results  Component Value Date   LDLCALC 91 06/16/2022   Lab Results  Component Value Date   TRIG 179.0 (H) 06/16/2022   Lab Results  Component Value Date   CHOLHDL 5 06/16/2022   No results found for: "HGBA1C"    Assessment & Plan:  ADHD (attention deficit hyperactivity disorder), inattentive type Assessment & Plan: Stable on medication. Continue Vyvanse 40 mg daily. Urine drug screen ordered.   Orders: -     Drug Screen, Urine -     Vyvanse; TAKE 1 CAPSULE BY MOUTH DAIL  Dispense: 30 capsule; Refill: 0 -     Vyvanse;  TAKE 1 CAPSULE BY MOUTH DAIL  Dispense:  30 capsule; Refill: 0 -     Vyvanse; TAKE 1 CAPSULE BY MOUTH DAIL  Dispense: 30 capsule; Refill: 0  Annual physical exam -     CBC with Differential/Platelet; Future -     Comprehensive metabolic panel; Future -     Lipid panel; Future -     TSH; Future    Follow-up: Return in about 4 months (around 06/17/2023) for physical, follow up with fasting lab 2 days prior.   Kara Dies, NP

## 2023-02-16 NOTE — Patient Instructions (Signed)
Please go the lab.

## 2023-02-17 LAB — DRUG SCREEN, URINE
Amphetamines, Urine: POSITIVE ng/mL — AB
Barbiturate screen, urine: NEGATIVE ng/mL
Benzodiazepine Quant, Ur: NEGATIVE ng/mL
Cannabinoid Quant, Ur: NEGATIVE ng/mL
Cocaine (Metab.): NEGATIVE ng/mL
Opiate Quant, Ur: NEGATIVE ng/mL
PCP Quant, Ur: NEGATIVE ng/mL

## 2023-02-20 MED ORDER — VYVANSE 40 MG PO CAPS: ORAL_CAPSULE | ORAL | 0 refills | Status: DC

## 2023-02-28 DIAGNOSIS — F900 Attention-deficit hyperactivity disorder, predominantly inattentive type: Secondary | ICD-10-CM | POA: Insufficient documentation

## 2023-02-28 NOTE — Assessment & Plan Note (Signed)
Stable on medication. Continue Vyvanse 40 mg daily. Urine drug screen ordered.

## 2023-03-17 ENCOUNTER — Other Ambulatory Visit: Payer: Self-pay | Admitting: Nurse Practitioner

## 2023-03-17 DIAGNOSIS — F900 Attention-deficit hyperactivity disorder, predominantly inattentive type: Secondary | ICD-10-CM

## 2023-03-20 MED ORDER — VYVANSE 40 MG PO CAPS: ORAL_CAPSULE | ORAL | 0 refills | Status: DC

## 2023-04-09 ENCOUNTER — Other Ambulatory Visit: Payer: Self-pay | Admitting: Nurse Practitioner

## 2023-04-09 DIAGNOSIS — F900 Attention-deficit hyperactivity disorder, predominantly inattentive type: Secondary | ICD-10-CM

## 2023-04-10 NOTE — Telephone Encounter (Signed)
Looks like pt should have refills at the pharmacy. Please refuse the refill request.

## 2023-05-11 ENCOUNTER — Other Ambulatory Visit: Payer: Self-pay | Admitting: Nurse Practitioner

## 2023-05-11 DIAGNOSIS — F900 Attention-deficit hyperactivity disorder, predominantly inattentive type: Secondary | ICD-10-CM

## 2023-05-17 MED ORDER — VYVANSE 40 MG PO CAPS: ORAL_CAPSULE | ORAL | 0 refills | Status: DC

## 2023-06-12 ENCOUNTER — Other Ambulatory Visit: Payer: Self-pay | Admitting: Nurse Practitioner

## 2023-06-12 DIAGNOSIS — F900 Attention-deficit hyperactivity disorder, predominantly inattentive type: Secondary | ICD-10-CM

## 2023-06-12 MED ORDER — VYVANSE 40 MG PO CAPS
ORAL_CAPSULE | ORAL | 0 refills | Status: DC
Start: 2023-06-12 — End: 2023-06-14

## 2023-06-12 MED ORDER — VYVANSE 40 MG PO CAPS: ORAL_CAPSULE | ORAL | 0 refills | Status: DC

## 2023-06-12 NOTE — Progress Notes (Signed)
 PDMP reviewed.

## 2023-06-14 ENCOUNTER — Other Ambulatory Visit: Payer: Self-pay | Admitting: Nurse Practitioner

## 2023-06-14 DIAGNOSIS — F900 Attention-deficit hyperactivity disorder, predominantly inattentive type: Secondary | ICD-10-CM

## 2023-06-14 MED ORDER — VYVANSE 40 MG PO CAPS: ORAL_CAPSULE | ORAL | 0 refills | Status: DC

## 2023-06-14 NOTE — Telephone Encounter (Signed)
 Last ov: 02/16/2023 Next OV: 06/21/2023

## 2023-06-21 ENCOUNTER — Encounter: Payer: BC Managed Care – PPO | Admitting: Nurse Practitioner

## 2023-06-21 DIAGNOSIS — F900 Attention-deficit hyperactivity disorder, predominantly inattentive type: Secondary | ICD-10-CM

## 2023-06-29 ENCOUNTER — Ambulatory Visit (INDEPENDENT_AMBULATORY_CARE_PROVIDER_SITE_OTHER): Admitting: Nurse Practitioner

## 2023-06-29 VITALS — BP 118/78 | HR 91 | Temp 98.2°F | Ht 74.0 in | Wt 210.0 lb

## 2023-06-29 DIAGNOSIS — Z1159 Encounter for screening for other viral diseases: Secondary | ICD-10-CM | POA: Diagnosis not present

## 2023-06-29 DIAGNOSIS — Z0000 Encounter for general adult medical examination without abnormal findings: Secondary | ICD-10-CM | POA: Diagnosis not present

## 2023-06-29 DIAGNOSIS — Z114 Encounter for screening for human immunodeficiency virus [HIV]: Secondary | ICD-10-CM

## 2023-06-29 LAB — CBC WITH DIFFERENTIAL/PLATELET
Basophils Absolute: 0 10*3/uL (ref 0.0–0.1)
Basophils Relative: 0.6 % (ref 0.0–3.0)
Eosinophils Absolute: 0.2 10*3/uL (ref 0.0–0.7)
Eosinophils Relative: 5 % (ref 0.0–5.0)
HCT: 45.8 % (ref 39.0–52.0)
Hemoglobin: 15.8 g/dL (ref 13.0–17.0)
Lymphocytes Relative: 34.4 % (ref 12.0–46.0)
Lymphs Abs: 1.4 10*3/uL (ref 0.7–4.0)
MCHC: 34.5 g/dL (ref 30.0–36.0)
MCV: 92.2 fl (ref 78.0–100.0)
Monocytes Absolute: 0.4 10*3/uL (ref 0.1–1.0)
Monocytes Relative: 10.1 % (ref 3.0–12.0)
Neutro Abs: 2 10*3/uL (ref 1.4–7.7)
Neutrophils Relative %: 49.9 % (ref 43.0–77.0)
Platelets: 241 10*3/uL (ref 150.0–400.0)
RBC: 4.97 Mil/uL (ref 4.22–5.81)
RDW: 12.5 % (ref 11.5–15.5)
WBC: 4 10*3/uL (ref 4.0–10.5)

## 2023-06-29 LAB — COMPREHENSIVE METABOLIC PANEL WITH GFR
ALT: 48 U/L (ref 0–53)
AST: 27 U/L (ref 0–37)
Albumin: 4.8 g/dL (ref 3.5–5.2)
Alkaline Phosphatase: 77 U/L (ref 39–117)
BUN: 9 mg/dL (ref 6–23)
CO2: 28 meq/L (ref 19–32)
Calcium: 10 mg/dL (ref 8.4–10.5)
Chloride: 103 meq/L (ref 96–112)
Creatinine, Ser: 0.85 mg/dL (ref 0.40–1.50)
GFR: 119.15 mL/min (ref >60.00)
Glucose, Bld: 95 mg/dL (ref 70–99)
Potassium: 4.2 meq/L (ref 3.5–5.1)
Sodium: 137 meq/L (ref 135–145)
Total Bilirubin: 0.7 mg/dL (ref 0.2–1.2)
Total Protein: 7.3 g/dL (ref 6.0–8.3)

## 2023-06-29 LAB — LIPID PANEL
Cholesterol: 181 mg/dL (ref 0–200)
HDL: 41.3 mg/dL (ref >39.00)
LDL Cholesterol: 116 mg/dL — ABNORMAL HIGH (ref 0–99)
NonHDL: 139.27
Total CHOL/HDL Ratio: 4
Triglycerides: 116 mg/dL (ref 0.0–149.0)
VLDL: 23.2 mg/dL (ref 0.0–40.0)

## 2023-06-29 LAB — TSH: TSH: 1.36 u[IU]/mL (ref 0.35–5.50)

## 2023-06-29 NOTE — Assessment & Plan Note (Addendum)
 Encouraged patient to consume a balanced diet and regular exercise regimen. -Advised to see an eye doctor and dentist annually.  -Uptodate on vaccine.

## 2023-06-29 NOTE — Progress Notes (Signed)
 Established Patient Office Visit  Subjective:  Patient ID: Mitchell Fry, male    DOB: 05-18-1995  Age: 28 y.o. MRN: [id number]  CC:  Chief Complaint  Patient presents with   Annual Exam    HPI  Mitchell Fry presents to the clinic for annual physical exam. Diet: Eats fish and chicken. Consumes lots of fruits and veggies. Fried food once a month. Patient drinks water and diet coke. Exercise: Work in the yard and walking at work  Vaccine Flu: declined Tetanus: 2022 COVID: J&J  Family history:  Colon cancer: No  Breast/prostate cancer: No   Dentist: as needed Ophthalmology:Due  HIV screening: today Hep C screening: today Tobacco use: Former  Alcohol use:  No Illicit drugs: No   HPI   Past Medical History:  Diagnosis Date   ADHD     Past Surgical History:  Procedure Laterality Date   arm surgery     Had to be sedated to have arm reset    Family History  Problem Relation Age of Onset   Depression Mother    Depression Father    Depression Sister    Mental illness Maternal Grandmother    Mental illness Maternal Grandfather    Mental illness Paternal Grandmother    Mental illness Paternal Grandfather     Social History   Socioeconomic History   Marital status: Single    Spouse name: Not on file   Number of children: Not on file   Years of education: Not on file   Highest education level: Some college, no degree  Occupational History   Occupation: Energy manager  Tobacco Use   Smoking status: Former    Current packs/day: 0.00    Average packs/day: 1 pack/day for 4.0 years (4.0 ttl pk-yrs)    Types: Cigarettes    Start date: 08/25/2012    Quit date: 08/25/2016    Years since quitting: 6.8   Smokeless tobacco: Never  Vaping Use   Vaping status: Never Used  Substance and Sexual Activity   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   Drug use: No   Sexual activity: Yes    Partners: Female    Birth control/protection: Pill  Other Topics  Concern   Not on file  Social History Narrative   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   Social Drivers of Health   Financial Resource Strain: Low Risk  (06/29/2023)   Overall Financial Resource Strain (CARDIA)    Difficulty of Paying Living Expenses: Not very hard  Food Insecurity: No Food Insecurity (06/29/2023)   Hunger Vital Sign    Worried About Running Out of Food in the Last Year: Never true    Ran Out of Food in the Last Year: Never true  Transportation Needs: No Transportation Needs (06/29/2023)   PRAPARE - Administrator, Civil Service (Medical): No    Lack of Transportation (Non-Medical): No  Physical Activity: Sufficiently Active (06/29/2023)   Exercise Vital Sign    Days of Exercise per Week: 5 days    Minutes of Exercise per Session: 150+ min  Stress: No Stress Concern Present (06/29/2023)   [employer] of Occupational Health - Occupational Stress Questionnaire    Feeling of Stress : Not at all  Social Connections: Moderately Integrated (06/29/2023)   Social Connection and Isolation Panel [NHANES]    Frequency of Communication with Friends and Family: Twice a week    Frequency of  Social Gatherings with Friends and Family: Once a week    Attends Religious Services: More than 4 times per year    Active Member of [employer] or Organizations: No    Attends Engineer, structural: Not on file    Marital Status: Married  Intimate Partner Violence: Unknown (12/28/2020)   Received from [facility]   Intimate Partner Violence    Fear of Current or Ex-Partner: Not asked    Emotionally Abused: Not asked    Physically Abused: Not asked    Sexually Abused: Not asked     Outpatient Medications Prior to Visit  Medication Sig Dispense Refill   VYVANSE  40 MG capsule TAKE 1 CAPSULE BY MOUTH DAIL 30 capsule 0   [START ON 07/12/2023] VYVANSE  40 MG capsule TAKE 1 CAPSULE BY MOUTH DAIL 30 capsule 0   VYVANSE  40 MG capsule TAKE 1  CAPSULE BY MOUTH DAIL 30 capsule 0   No facility-administered medications prior to visit.    No Known Allergies  ROS Review of Systems Negative unless indicated in HPI.    Objective:    Physical Exam Constitutional:      Appearance: Normal appearance. He is normal weight.  HENT:     Head: Normocephalic.     Right Ear: Tympanic membrane normal.     Left Ear: Tympanic membrane normal.     Mouth/Throat:     Mouth: Mucous membranes are moist.  Eyes:     Extraocular Movements: Extraocular movements intact.     Conjunctiva/sclera: Conjunctivae normal.     Pupils: Pupils are equal, round, and reactive to light.  Neck:     Thyroid: No thyroid mass or thyroid tenderness.  Cardiovascular:     Rate and Rhythm: Normal rate and regular rhythm.     Pulses: Normal pulses.     Heart sounds: Normal heart sounds. No murmur heard. Pulmonary:     Effort: Pulmonary effort is normal.     Breath sounds: Normal breath sounds.  Abdominal:     General: Bowel sounds are normal.     Palpations: Abdomen is soft. There is no mass.     Tenderness: There is no abdominal tenderness. There is no rebound.  Musculoskeletal:        General: No swelling.     Cervical back: Neck supple. No tenderness.     Right lower leg: No edema.     Left lower leg: No edema.  Skin:    Findings: No bruising, erythema or rash.  Neurological:     General: No focal deficit present.     Mental Status: He is alert and oriented to person, place, and time. Mental status is at baseline.  Psychiatric:        Mood and Affect: Mood normal.        Behavior: Behavior normal.        Thought Content: Thought content normal.        Judgment: Judgment normal.     BP 118/78   Pulse 91   Temp 98.2 F (36.8 C)   Ht 6\' 2"  (1.88 m)   Wt 210 lb (95.3 kg)   SpO2 99%   BMI 26.96 kg/m  Wt Readings from Last 3 Encounters:  06/29/23 210 lb (95.3 kg)  02/16/23 208 lb 12.8 oz (94.7 kg)  09/15/22 208 lb (94.3 kg)     Health  Maintenance  Topic Date Due   HIV Screening  Never done   Hepatitis C Screening  Never done   COVID-19 Vaccine (1) 07/04/2023 (Originally 01/21/2001)   INFLUENZA VACCINE  10/05/2023   DTaP/Tdap/Td (8 - Td or Tdap) 12/29/2030   HPV VACCINES  Completed   Pneumococcal Vaccine 32-8 Years old  Aged Out   Meningococcal B Vaccine  Aged Out    There are no preventive care reminders to display for this patient.  Lab Results  Component Value Date   TSH 1.30 06/16/2022   Lab Results  Component Value Date   WBC 5.2 06/16/2022   HGB 15.8 06/16/2022   HCT 45.5 06/16/2022   MCV 91.6 06/16/2022   PLT 232.0 06/16/2022   Lab Results  Component Value Date   NA 138 06/16/2022   K 4.3 06/16/2022   CO2 27 06/16/2022   GLUCOSE 85 06/16/2022   BUN 14 06/16/2022   CREATININE 0.84 06/16/2022   BILITOT 0.4 06/16/2022   ALKPHOS 85 06/16/2022   AST 23 06/16/2022   ALT 34 06/16/2022   PROT 7.1 06/16/2022   ALBUMIN 4.7 06/16/2022   CALCIUM 9.7 06/16/2022   EGFR 123 06/03/2021   GFR 120.45 06/16/2022   Lab Results  Component Value Date   CHOL 162 06/16/2022   Lab Results  Component Value Date   HDL 35.50 (L) 06/16/2022   Lab Results  Component Value Date   LDLCALC 91 06/16/2022   Lab Results  Component Value Date   TRIG 179.0 (H) 06/16/2022   Lab Results  Component Value Date   CHOLHDL 5 06/16/2022   No results found for: "HGBA1C"    Assessment & Plan:  Need for hepatitis C screening test -     Hepatitis C antibody  Encounter for screening for HIV -     HIV Antibody (routine testing w rflx)  Annual physical exam Assessment & Plan: Encouraged patient to consume a balanced diet and regular exercise regimen. Advised to see an eye doctor and dentist annually.  Up to date on vaccine.    Orders: -     TSH -     Lipid panel -     Comprehensive metabolic panel with GFR -     CBC with Differential/Platelet    Follow-up: Return in about 6 months (around 12/29/2023).    Anaclara Acklin, NP

## 2023-06-29 NOTE — Patient Instructions (Signed)
 Preventive Care 76-28 Years Old, Male Preventive care refers to lifestyle choices and visits with your health care provider that can promote health and wellness. Preventive care visits are also called wellness exams. What can I expect for my preventive care visit? Counseling During your preventive care visit, your health care provider may ask about your: Medical history, including: Past medical problems. Family medical history. Current health, including: Emotional well-being. Home life and relationship well-being. Sexual activity. Lifestyle, including: Alcohol, nicotine or tobacco, and drug use. Access to firearms. Diet, exercise, and sleep habits. Safety issues such as seatbelt and bike helmet use. Sunscreen use. Work and work Astronomer. Physical exam Your health care provider may check your: Height and weight. These may be used to calculate your BMI (body mass index). BMI is a measurement that tells if you are at a healthy weight. Waist circumference. This measures the distance around your waistline. This measurement also tells if you are at a healthy weight and may help predict your risk of certain diseases, such as type 2 diabetes and high blood pressure. Heart rate and blood pressure. Body temperature. Skin for abnormal spots. What immunizations do I need?  Vaccines are usually given at various ages, according to a schedule. Your health care provider will recommend vaccines for you based on your age, medical history, and lifestyle or other factors, such as travel or where you work. What tests do I need? Screening Your health care provider may recommend screening tests for certain conditions. This may include: Lipid and cholesterol levels. Diabetes screening. This is done by checking your blood sugar (glucose) after you have not eaten for a while (fasting). Hepatitis B test. Hepatitis C test. HIV (human immunodeficiency virus) test. STI (sexually transmitted infection)  testing, if you are at risk. Talk with your health care provider about your test results, treatment options, and if necessary, the need for more tests. Follow these instructions at home: Eating and drinking  Eat a healthy diet that includes fresh fruits and vegetables, whole grains, lean protein, and low-fat dairy products. Drink enough fluid to keep your urine pale yellow. Take vitamin and mineral supplements as recommended by your health care provider. Do not drink alcohol if your health care provider tells you not to drink. If you drink alcohol: Limit how much you have to 0-2 drinks a day. Know how much alcohol is in your drink. In the [place] one drink equals one 12 oz bottle of beer (355 mL), one 5 oz glass of wine (148 mL), or one 1 oz glass of hard liquor (44 mL). Lifestyle Brush your teeth every morning and night with fluoride toothpaste. Floss one time each day. Exercise for at least 30 minutes 5 or more days each week. Do not use any products that contain nicotine or tobacco. These products include cigarettes, chewing tobacco, and vaping devices, such as e-cigarettes. If you need help quitting, ask your health care provider. Do not use drugs. If you are sexually active, practice safe sex. Use a condom or other form of protection to prevent STIs. Find healthy ways to manage stress, such as: Meditation, yoga, or listening to music. Journaling. Talking to a trusted person. Spending time with friends and family. Minimize exposure to UV radiation to reduce your risk of skin cancer. Safety Always wear your seat belt while driving or riding in a vehicle. Do not drive: If you have been drinking alcohol. Do not ride with someone who has been drinking. If you have been using any mind-altering substances  or drugs. While texting. When you are tired or distracted. Wear a helmet and other protective equipment during sports activities. If you have firearms in your house, make sure you  follow all gun safety procedures. Seek help if you have been physically or sexually abused. What's next? Go to your health care provider once a year for an annual wellness visit. Ask your health care provider how often you should have your eyes and teeth checked. Stay up to date on all vaccines. This information is not intended to replace advice given to you by your health care provider. Make sure you discuss any questions you have with your health care provider. Document Revised: 08/18/2020 Document Reviewed: 08/18/2020 Elsevier Patient Education  2024 [employer].

## 2023-06-30 LAB — HIV ANTIBODY (ROUTINE TESTING W REFLEX): HIV 1&2 Ab, 4th Generation: NONREACTIVE

## 2023-06-30 LAB — HEPATITIS C ANTIBODY: Hepatitis C Ab: NONREACTIVE

## 2023-07-06 ENCOUNTER — Encounter: Payer: Self-pay | Admitting: Nurse Practitioner

## 2023-10-08 ENCOUNTER — Other Ambulatory Visit: Payer: Self-pay | Admitting: Nurse Practitioner

## 2023-10-08 DIAGNOSIS — F900 Attention-deficit hyperactivity disorder, predominantly inattentive type: Secondary | ICD-10-CM

## 2023-10-08 NOTE — Telephone Encounter (Signed)
 Copied from CRM [id number]. Topic: Clinical - Medication Refill >> Oct 08, 2023  8:41 AM Rosina BIRCH wrote: Medication: VYVANSE  40 MG capsule  Has the patient contacted their pharmacy? Yes (Agent: If no, request that the patient contact the pharmacy for the refill. If patient does not wish to contact the pharmacy document the reason why and proceed with request.) (Agent: If yes, when and what did the pharmacy advise?)  This is the patient's preferred pharmacy:  [id number] [place], [place] - [address] [place] [address] Phone: [phone] Fax: [phone]  Is this the correct pharmacy for this prescription? Yes If no, delete pharmacy and type the correct one.   Has the prescription been filled recently? Yes  Is the patient out of the medication? No  Has the patient been seen for an appointment in the last year OR does the patient have an upcoming appointment? Yes  Can we respond through MyChart? Yes  Agent: Please be advised that Rx refills may take up to 3 business days. We ask that you follow-up with your pharmacy.

## 2023-10-09 ENCOUNTER — Encounter: Payer: Self-pay | Admitting: Nurse Practitioner

## 2023-10-09 DIAGNOSIS — F900 Attention-deficit hyperactivity disorder, predominantly inattentive type: Secondary | ICD-10-CM

## 2023-10-09 MED ORDER — VYVANSE 40 MG PO CAPS: ORAL_CAPSULE | ORAL | 0 refills | Status: DC

## 2023-10-09 NOTE — Telephone Encounter (Signed)
  Contract: No UDS: NO Last Visit: 06/29/2023 Next Visit: 12/28/2023 Last Refill: E-Prescribing Status: Receipt confirmed by pharmacy (06/12/2023  5:02 PM EDT)   Please Advise

## 2023-10-09 NOTE — Telephone Encounter (Signed)
Controlled substance database reviewed.  Medication sent to pharmacy.

## 2023-11-06 ENCOUNTER — Other Ambulatory Visit: Payer: Self-pay | Admitting: Nurse Practitioner

## 2023-11-06 DIAGNOSIS — F900 Attention-deficit hyperactivity disorder, predominantly inattentive type: Secondary | ICD-10-CM

## 2023-11-06 MED ORDER — VYVANSE 40 MG PO CAPS: ORAL_CAPSULE | ORAL | 0 refills | Status: DC

## 2023-11-06 NOTE — Progress Notes (Signed)
Controlled substance database reviewed.  Medication sent to pharmacy.

## 2023-12-28 ENCOUNTER — Ambulatory Visit: Admitting: Nurse Practitioner

## 2023-12-28 ENCOUNTER — Telehealth: Payer: Self-pay

## 2023-12-28 ENCOUNTER — Encounter: Payer: Self-pay | Admitting: Nurse Practitioner

## 2023-12-28 VITALS — BP 118/76 | HR 76 | Temp 98.3°F | Ht 74.0 in | Wt 217.0 lb

## 2023-12-28 DIAGNOSIS — E663 Overweight: Secondary | ICD-10-CM | POA: Diagnosis not present

## 2023-12-28 DIAGNOSIS — G479 Sleep disorder, unspecified: Secondary | ICD-10-CM

## 2023-12-28 DIAGNOSIS — F900 Attention-deficit hyperactivity disorder, predominantly inattentive type: Secondary | ICD-10-CM

## 2023-12-28 MED ORDER — VYVANSE 40 MG PO CAPS: ORAL_CAPSULE | ORAL | 0 refills | Status: DC

## 2023-12-28 MED ORDER — VYVANSE 40 MG PO CAPS: ORAL_CAPSULE | ORAL | 0 refills | Status: AC

## 2023-12-28 NOTE — Assessment & Plan Note (Addendum)
 Stable on melatonin. - Continue melatonin as needed.

## 2023-12-28 NOTE — Telephone Encounter (Signed)
 Patient saw Chelsea Aurora, NP, today and at check-out states he is supposed to schedule a lab appointment in April, 2026.  I scheduled appointment for patient, but the lab orders will need to be entered.

## 2023-12-28 NOTE — Assessment & Plan Note (Signed)
 Body mass index is 27.86 kg/m. -Encouraged him to follow regular exercise and diet management.

## 2023-12-28 NOTE — Telephone Encounter (Signed)
 I left voicemail for patient letting him know that Chelsea Aurora, NP, just let me know that we need to schedule a physical to go along with his lab visit for 2026.  I let patient know that he had his last physical on 06/29/2023, so we will need to schedule his physical and lab appointment after 06/28/2024.  I asked patient to please call us  to schedule.  E2C2 - when patient calls back, please assist him with scheduling.  Patient's lab visit on 06/27/2024 will need to be rescheduled for a time after 06/28/2024.  The lab visit should be scheduled two days prior to the appointment for his physical.

## 2023-12-28 NOTE — Assessment & Plan Note (Addendum)
 Stable on medication. -Continue Vyvanse  40 mg daily. -Non opoid contract signed and  medication refilled.

## 2023-12-28 NOTE — Progress Notes (Signed)
 Established Patient Office Visit  Subjective:  Patient ID: Mitchell Fry, male    DOB: Jul 02, 1995  Age: 28 y.o. MRN: [id number]  CC:  Chief Complaint  Patient presents with   Medical Management of Chronic Issues   Discussed the use of a AI scribe software for clinical note transcription with the patient, who gave verbal consent to proceed.  HPI  Mitchell Fry presents for follow up on chronic management.   He takes Vyvanse  at 5:30 AM before work, which he finds helpful. He has been at his current job for a year and six months, and it is going well.  He uses melatonin as needed for sleep and has no issues with sleeping. He denies symptoms of depression or anxiety but is interested in therapy to stabilize his mental health.  He has no issues with bowel movements, including no constipation or diarrhea, and no stomach pain.  HPI   Past Medical History:  Diagnosis Date   ADHD     Past Surgical History:  Procedure Laterality Date   arm surgery     Had to be sedated to have arm reset    Family History  Problem Relation Age of Onset   Depression Mother    Depression Father    Depression Sister    Mental illness Maternal Grandmother    Mental illness Maternal Grandfather    Mental illness Paternal Grandmother    Mental illness Paternal Grandfather     Social History   Socioeconomic History   Marital status: Single    Spouse name: Not on file   Number of children: Not on file   Years of education: Not on file   Highest education level: Some college, no degree  Occupational History   Occupation: Energy manager  Tobacco Use   Smoking status: Former    Current packs/day: 0.00    Average packs/day: 1 pack/day for 4.0 years (4.0 ttl pk-yrs)    Types: Cigarettes    Start date: 08/25/2012    Quit date: 08/25/2016    Years since quitting: 7.3   Smokeless tobacco: Never  Vaping Use   Vaping status: Never Used  Substance and Sexual Activity   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   Drug use: No   Sexual activity: Yes    Partners: Female    Birth control/protection: Pill  Other Topics Concern   Not on file  Social History Narrative   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   Social Drivers of Health   Financial Resource Strain: Low Risk  (06/29/2023)   Overall Financial Resource Strain (CARDIA)    Difficulty of Paying Living Expenses: Not very hard  Food Insecurity: No Food Insecurity (06/29/2023)   Hunger Vital Sign    Worried About Running Out of Food in the Last Year: Never true    Ran Out of Food in the Last Year: Never true  Transportation Needs: No Transportation Needs (06/29/2023)   PRAPARE - Administrator, Civil Service (Medical): No    Lack of Transportation (Non-Medical): No  Physical Activity: Sufficiently Active (06/29/2023)   Exercise Vital Sign    Days of Exercise per Week: 5 days    Minutes of Exercise per Session: 150+ min  Stress: No Stress Concern Present (06/29/2023)   [employer] of Occupational Health - Occupational Stress Questionnaire    Feeling of Stress : Not at all  Social Connections: Moderately Integrated (  06/29/2023)   Social Connection and Isolation Panel    Frequency of Communication with Friends and Family: Twice a week    Frequency of Social Gatherings with Friends and Family: Once a week    Attends Religious Services: More than 4 times per year    Active Member of [employer] or Organizations: No    Attends Banker Meetings: Not on file    Marital Status: Married  Intimate Partner Violence: Unknown (12/28/2020)   Received from [facility]   Intimate Partner Violence    Fear of Current or Ex-Partner: Not asked    Emotionally Abused: Not asked    Physically Abused: Not asked    Sexually Abused: Not asked     Outpatient Medications Prior to Visit  Medication Sig Dispense Refill   [START ON 01/05/2024] VYVANSE  40 MG capsule TAKE 1 CAPSULE BY MOUTH DAIL  30 capsule 0   VYVANSE  40 MG capsule TAKE 1 CAPSULE BY MOUTH DAIL 30 capsule 0   VYVANSE  40 MG capsule TAKE 1 CAPSULE BY MOUTH DAIL 30 capsule 0   No facility-administered medications prior to visit.    No Known Allergies  ROS Review of Systems Negative unless indicated in HPI.    Objective:    Physical Exam Constitutional:      Appearance: Normal appearance.  HENT:     Mouth/Throat:     Mouth: Mucous membranes are moist.  Eyes:     Conjunctiva/sclera: Conjunctivae normal.     Pupils: Pupils are equal, round, and reactive to light.  Cardiovascular:     Rate and Rhythm: Normal rate and regular rhythm.     Pulses: Normal pulses.     Heart sounds: Normal heart sounds.  Pulmonary:     Effort: Pulmonary effort is normal.     Breath sounds: Normal breath sounds.  Musculoskeletal:     Cervical back: Normal range of motion. No tenderness.  Skin:    General: Skin is warm.     Findings: No bruising.  Neurological:     General: No focal deficit present.     Mental Status: He is alert and oriented to person, place, and time. Mental status is at baseline.  Psychiatric:        Mood and Affect: Mood normal.        Behavior: Behavior normal.        Thought Content: Thought content normal.        Judgment: Judgment normal.     BP 118/76   Pulse 76   Temp 98.3 F (36.8 C)   Ht 6' 2 (1.88 m)   Wt 217 lb (98.4 kg)   SpO2 99%   BMI 27.86 kg/m  Wt Readings from Last 3 Encounters:  12/28/23 217 lb (98.4 kg)  06/29/23 210 lb (95.3 kg)  02/16/23 208 lb 12.8 oz (94.7 kg)     Health Maintenance  Topic Date Due   COVID-19 Vaccine (1) 01/12/2024 (Originally 01/21/2001)   Influenza Vaccine  06/03/2024 (Originally 10/05/2023)   DTaP/Tdap/Td (8 - Td or Tdap) 12/29/2030   HPV VACCINES  Completed   Hepatitis C Screening  Completed   HIV Screening  Completed   Pneumococcal Vaccine  Aged Out   Meningococcal B Vaccine  Aged Out   Hepatitis B Vaccines 19-59 Average Risk   Discontinued    There are no preventive care reminders to display for this patient.  Lab Results  Component Value Date   TSH 1.36 06/29/2023   Lab Results  Component Value Date   WBC 4.0 06/29/2023   HGB 15.8 06/29/2023   HCT 45.8 06/29/2023   MCV 92.2 06/29/2023   PLT 241.0 06/29/2023   Lab Results  Component Value Date   NA 137 06/29/2023   K 4.2 06/29/2023   CO2 28 06/29/2023   GLUCOSE 95 06/29/2023   BUN 9 06/29/2023   CREATININE 0.85 06/29/2023   BILITOT 0.7 06/29/2023   ALKPHOS 77 06/29/2023   AST 27 06/29/2023   ALT 48 06/29/2023   PROT 7.3 06/29/2023   ALBUMIN 4.8 06/29/2023   CALCIUM 10.0 06/29/2023   EGFR 123 06/03/2021   GFR 119.15 06/29/2023   Lab Results  Component Value Date   CHOL 181 06/29/2023   Lab Results  Component Value Date   HDL 41.30 06/29/2023   Lab Results  Component Value Date   LDLCALC 116 (H) 06/29/2023   Lab Results  Component Value Date   TRIG 116.0 06/29/2023   Lab Results  Component Value Date   CHOLHDL 4 06/29/2023   No results found for: HGBA1C    Assessment & Plan:  ADHD (attention deficit hyperactivity disorder), inattentive type Assessment & Plan: Stable on medication. -Continue Vyvanse  40 mg daily. -Non opoid contract signed and  medication refilled.    Orders: -     Vyvanse ; TAKE 1 CAPSULE BY MOUTH DAIL  Dispense: 60 capsule; Refill: 0 -     Vyvanse ; TAKE 1 CAPSULE BY MOUTH DAIL  Dispense: 60 capsule; Refill: 0 -     Vyvanse ; TAKE 1 CAPSULE BY MOUTH DAIL  Dispense: 60 capsule; Refill: 0  Sleep disorder Assessment & Plan: Stable on melatonin. - Continue melatonin as needed.   Overweight (BMI 25.0-29.9) Assessment & Plan: Body mass index is 27.86 kg/m. -Encouraged him to follow regular exercise and diet management.   Declined flu vaccine.  Follow-up: No follow-ups on file.   Shereese Bonnie, NP

## 2024-02-01 DIAGNOSIS — J069 Acute upper respiratory infection, unspecified: Secondary | ICD-10-CM | POA: Diagnosis not present

## 2024-02-01 DIAGNOSIS — R070 Pain in throat: Secondary | ICD-10-CM | POA: Diagnosis not present

## 2024-03-03 ENCOUNTER — Telehealth: Payer: Self-pay

## 2024-03-03 ENCOUNTER — Other Ambulatory Visit: Payer: Self-pay | Admitting: Family

## 2024-03-03 DIAGNOSIS — F900 Attention-deficit hyperactivity disorder, predominantly inattentive type: Secondary | ICD-10-CM

## 2024-03-03 MED ORDER — LISDEXAMFETAMINE DIMESYLATE 40 MG PO CAPS: ORAL_CAPSULE | ORAL | 0 refills | Status: AC

## 2024-03-03 NOTE — Telephone Encounter (Signed)
 Copied from CRM #[id number]. Topic: Clinical - Prescription Issue >> Mar 03, 2024 11:17 AM Ahlexyia S wrote: Reason for CRM: Pt called in stating that pharmacy is requesting for provider to send in the generic brand for medication VYVANSE  40 MG capsule. Pt stated his insurance is no longer covering current brand so he is needing generic. Pt is requesting a callback or message via my chart when updated prescription has been sent to pharmacy on file.

## 2024-03-03 NOTE — Telephone Encounter (Signed)
 Patient was notified and made aware requested Rx has been sent to patient's pharmacy. Patient verbalized understanding and has no further questions at this time.

## 2024-03-28 ENCOUNTER — Telehealth: Payer: Self-pay

## 2024-03-28 ENCOUNTER — Other Ambulatory Visit: Payer: Self-pay

## 2024-03-28 DIAGNOSIS — Z0000 Encounter for general adult medical examination without abnormal findings: Secondary | ICD-10-CM

## 2024-03-28 DIAGNOSIS — E663 Overweight: Secondary | ICD-10-CM

## 2024-03-28 NOTE — Telephone Encounter (Signed)
 Labs ordered.

## 2024-03-28 NOTE — Telephone Encounter (Signed)
 Copied from CRM #[id number]. Topic: Clinical - Request for Lab/Test Order >> Mar 28, 2024  3:37 PM Alfonso ORN wrote: Reason for CRM: pt called to schedule lab visit for physical as advised. Pt would like to reschedule previously scheduled appt for another time , no lab order. Please call pt to schedule

## 2024-03-28 NOTE — Addendum Note (Signed)
 Addended by: SEBASTIAN NORRIS A on: 03/28/2024 04:30 PM   Modules accepted: Orders

## 2024-06-27 ENCOUNTER — Other Ambulatory Visit

## 2024-07-04 ENCOUNTER — Encounter: Admitting: Nurse Practitioner
